# Patient Record
Sex: Male | Born: 1960 | Race: Black or African American | Hispanic: No | Marital: Married | Smoking: Former smoker
Health system: Southern US, Community
[De-identification: ages and names within clinical notes are randomized; demographics above are authoritative.]

## PROBLEM LIST (undated history)

## (undated) DIAGNOSIS — Z79899 Other long term (current) drug therapy: Secondary | ICD-10-CM

## (undated) DIAGNOSIS — E119 Type 2 diabetes mellitus without complications: Secondary | ICD-10-CM

## (undated) DIAGNOSIS — C801 Malignant (primary) neoplasm, unspecified: Secondary | ICD-10-CM

## (undated) DIAGNOSIS — I10 Essential (primary) hypertension: Secondary | ICD-10-CM

## (undated) DIAGNOSIS — D649 Anemia, unspecified: Secondary | ICD-10-CM

## (undated) DIAGNOSIS — Z7969 Long term (current) use of other immunomodulators and immunosuppressants: Secondary | ICD-10-CM

## (undated) HISTORY — PX: COLON SURGERY: SHX602

---

## 2011-06-07 HISTORY — PX: PORTACATH PLACEMENT: SHX2246

## 2014-06-19 ENCOUNTER — Other Ambulatory Visit: Payer: Self-pay | Admitting: Hematology & Oncology

## 2014-06-19 DIAGNOSIS — C787 Secondary malignant neoplasm of liver and intrahepatic bile duct: Principal | ICD-10-CM

## 2014-06-19 DIAGNOSIS — C20 Malignant neoplasm of rectum: Secondary | ICD-10-CM

## 2014-07-01 ENCOUNTER — Ambulatory Visit
Admission: RE | Admit: 2014-07-01 | Discharge: 2014-07-01 | Disposition: A | Discharge status: Home / Self Care | Payer: Medicare Other | Source: Ambulatory Visit | Attending: Hematology & Oncology | Admitting: Hematology & Oncology

## 2014-07-01 DIAGNOSIS — C787 Secondary malignant neoplasm of liver and intrahepatic bile duct: Principal | ICD-10-CM

## 2014-07-01 DIAGNOSIS — C20 Malignant neoplasm of rectum: Secondary | ICD-10-CM

## 2014-07-01 HISTORY — DX: Other long term (current) drug therapy: Z79.899

## 2014-07-01 HISTORY — DX: Long term (current) use of other immunomodulators and immunosuppressants: Z79.69

## 2014-07-01 HISTORY — PX: IR GENERIC HISTORICAL: IMG1180011

## 2014-07-01 HISTORY — DX: Anemia, unspecified: D64.9

## 2014-07-01 HISTORY — DX: Malignant (primary) neoplasm, unspecified: C80.1

## 2014-07-01 HISTORY — DX: Essential (primary) hypertension: I10

## 2014-07-01 NOTE — Consult Note (Addendum)
Chief Complaint: Chief Complaint  Patient presents with  . Advice Only    Consult for Y-90 SIRT for liver metastases    Referring Physician(s): Harish,V.C.  History of Present Illness: Marcus Buck is a 54 y.o. 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  He is currently status post 45 palliative treatments of systemic chemotherapy with FOLFOX plus Avastin.  Surveillance imaging in January 2015 demonstrated progression of hepatic metastatic disease, therefore he was transitioned to FOLFIRI plus Avastin and is now status post 26 treatments.    Initial response to second line therapy was positive with initial response to therapy followed by stable disease. However, continued surveillance imaging obtained on 06/09/2014 again demonstrated progression of his hepatic metastatic disease. His Avastin was held at his last treatment session.   Today, Marcus Buck is completely asymptomatic. He denies abdominal pain, nausea, vomiting, bright red blood per rectum or melanotic stools. He has no fever, chills or new unintentional weight loss. He continues to work part-time and is able to perform all of his activities of daily living without issue. His current functional status is excellent.   Past Medical History  Diagnosis Date  . Hypertension   . Anemia   . Palliative chemotherapy underway   . Cancer     Stage IV Metastatic adenocarcinoma of Liver, primary rectum    No past surgical history on file.  Allergies: Review of patient's allergies indicates no known allergies.  Medications: Prior to Admission medications   Medication Sig Start Date End Date Taking? Authorizing Provider  amLODipine (NORVASC) 5 MG tablet Take 5 mg by mouth 2 (two) times daily.   Yes Historical Provider, MD  lisinopril  (PRINIVIL,ZESTRIL) 20 MG tablet Take 20 mg by mouth 2 (two) times daily.   Yes Historical Provider, MD    No family history on file.  History   Social History  . Marital Status: Married    Spouse Name: N/A    Number of Children: N/A  . Years of Education: N/A   Social History Main Topics  . Smoking status: Former Smoker -- 0.26 packs/day    Types: Cigarettes    Start date: 07/01/1974    Quit date: 07/01/1988  . Smokeless tobacco: Not on file  . Alcohol Use: 3.6 oz/week    6 Cans of beer per week  . Drug Use: No  . Sexual Activity: Not on file   Other Topics Concern  . Not on file   Social History Narrative  . No narrative on file    ECOG Status: 0 - Asymptomatic  Review of Systems: A 12 point ROS discussed and pertinent positives are indicated in the HPI above.  All other systems are negative.  Review of Systems  Vital Signs: BP 177/105 mmHg  Pulse 74  Temp(Src) 98.4 F (36.9 C) (Oral)  Resp 14  Ht 5' 10"  (1.778 m)  Wt 175 lb (79.379 kg)  BMI 25.11 kg/m2  SpO2 100%  Physical Exam  Constitutional: He is oriented to person, place, and time. He appears well-developed and well-nourished.  HENT:  Head: Normocephalic and atraumatic.  Eyes: No scleral icterus.  Cardiovascular: Normal rate.   Pulmonary/Chest: Effort normal.  Abdominal: Soft. He exhibits no distension.  Neurological: He is alert and oriented to person, place, and time.  Skin: Skin is warm and  dry.  Psychiatric: He has a normal mood and affect. His behavior is normal.  Nursing note and vitals reviewed.   Imaging: No results found.  Labs:  CBC: Outside labs from [facility] dated 06/16/2014 include WBC 9.9, Hgb 14.6, HCT 45.1, PLT 239  COAGS: No results for input(s): INR, APTT in the last 8760 hours.  BMP: Outside labs from [facility] dated 06/16/2014 include creatinine 1.0 and EGFR 78  LIVER FUNCTION TESTS: Outside labs from [facility] dated 06/16/2014 include total bilirubin 1.0, AST 28, ALT 57, albumin 4.6  TUMOR MARKERS: No results for input(s): AFPTM, CEA, CA199, CHROMGRNA in the last 8760 hours.  Assessment and Plan:  54 year old gentleman with Stage 4 chemotherapy refractive colorectal cancer metastatic to the liver. He is an excellent candidate for concomitant transarterial radioembolization (TARE) with Y90 as he continues additional chemotherapy.  He has been off Avastin for the past 3-4 weeks and is therefore ready to proceed with his initial planning treatments followed by first left, and then right hepatic lobar treatments.  His baseline liver function appears excellent.  We will get him scheduled for our next available pre-Y90 planning procedure.     Thank you for this interesting consult.  I greatly enjoyed meeting Marcus Buck and look forward to participating in their care.   SignedJacqulynn Cadet 07/01/2014, 9:23 AM   I spent a total of 30 minutes face to face in clinical consultation, greater than 50% of which was counseling/coordinating care for colorectal cancer metastatic to the liver.

## 2014-07-10 ENCOUNTER — Other Ambulatory Visit: Payer: Self-pay | Admitting: Interventional Radiology

## 2014-07-10 DIAGNOSIS — C787 Secondary malignant neoplasm of liver and intrahepatic bile duct: Principal | ICD-10-CM

## 2014-07-10 DIAGNOSIS — C189 Malignant neoplasm of colon, unspecified: Secondary | ICD-10-CM

## 2014-07-22 ENCOUNTER — Other Ambulatory Visit: Payer: Self-pay | Admitting: Radiology

## 2014-07-24 ENCOUNTER — Ambulatory Visit (HOSPITAL_COMMUNITY)
Admission: RE | Admit: 2014-07-24 | Discharge: 2014-07-24 | Disposition: A | Discharge status: Home / Self Care | Payer: Medicare Other | Source: Ambulatory Visit | Attending: Interventional Radiology | Admitting: Interventional Radiology

## 2014-07-24 ENCOUNTER — Encounter (HOSPITAL_COMMUNITY): Payer: Medicare Other

## 2014-07-24 ENCOUNTER — Encounter (HOSPITAL_COMMUNITY)
Admission: RE | Admit: 2014-07-24 | Discharge: 2014-07-24 | Disposition: A | Discharge status: Home / Self Care | Payer: Medicare Other | Source: Ambulatory Visit | Attending: Interventional Radiology | Admitting: Interventional Radiology

## 2014-07-24 ENCOUNTER — Other Ambulatory Visit: Payer: Self-pay | Admitting: Interventional Radiology

## 2014-07-24 ENCOUNTER — Encounter (HOSPITAL_COMMUNITY): Payer: Self-pay

## 2014-07-24 DIAGNOSIS — Z87891 Personal history of nicotine dependence: Secondary | ICD-10-CM | POA: Diagnosis not present

## 2014-07-24 DIAGNOSIS — I10 Essential (primary) hypertension: Secondary | ICD-10-CM | POA: Diagnosis not present

## 2014-07-24 DIAGNOSIS — C20 Malignant neoplasm of rectum: Secondary | ICD-10-CM | POA: Insufficient documentation

## 2014-07-24 DIAGNOSIS — C189 Malignant neoplasm of colon, unspecified: Secondary | ICD-10-CM

## 2014-07-24 DIAGNOSIS — C19 Malignant neoplasm of rectosigmoid junction: Secondary | ICD-10-CM | POA: Insufficient documentation

## 2014-07-24 DIAGNOSIS — C787 Secondary malignant neoplasm of liver and intrahepatic bile duct: Principal | ICD-10-CM

## 2014-07-24 DIAGNOSIS — D649 Anemia, unspecified: Secondary | ICD-10-CM | POA: Diagnosis not present

## 2014-07-24 LAB — PROTIME-INR
INR: 0.91 (ref 0.00–1.49)
PROTHROMBIN TIME: 12.3 s (ref 11.6–15.2)

## 2014-07-24 LAB — COMPREHENSIVE METABOLIC PANEL
ALT: 31 U/L (ref 0–53)
AST: 43 U/L — ABNORMAL HIGH (ref 0–37)
Albumin: 3.9 g/dL (ref 3.5–5.2)
Alkaline Phosphatase: 86 U/L (ref 39–117)
Anion gap: 9 (ref 5–15)
BILIRUBIN TOTAL: 0.4 mg/dL (ref 0.3–1.2)
BUN: 13 mg/dL (ref 6–23)
CALCIUM: 9.3 mg/dL (ref 8.4–10.5)
CO2: 27 mmol/L (ref 19–32)
CREATININE: 0.88 mg/dL (ref 0.50–1.35)
Chloride: 105 mmol/L (ref 96–112)
GFR calc Af Amer: >90 mL/min (ref >90)
Glucose, Bld: 84 mg/dL (ref 70–99)
Potassium: 4.2 mmol/L (ref 3.5–5.1)
Sodium: 141 mmol/L (ref 135–145)
Total Protein: 6.9 g/dL (ref 6.0–8.3)

## 2014-07-24 LAB — CBC WITH DIFFERENTIAL/PLATELET
Basophils Absolute: 0 10*3/uL (ref 0.0–0.1)
Basophils Relative: 1 % (ref 0–1)
Eosinophils Absolute: 0.2 10*3/uL (ref 0.0–0.7)
Eosinophils Relative: 4 % (ref 0–5)
HEMATOCRIT: 38.3 % — AB (ref 39.0–52.0)
HEMOGLOBIN: 12.5 g/dL — AB (ref 13.0–17.0)
LYMPHS PCT: 19 % (ref 12–46)
Lymphs Abs: 0.8 10*3/uL (ref 0.7–4.0)
MCH: 30 pg (ref 26.0–34.0)
MCHC: 32.6 g/dL (ref 30.0–36.0)
MCV: 91.8 fL (ref 78.0–100.0)
MONO ABS: 0.4 10*3/uL (ref 0.1–1.0)
MONOS PCT: 10 % (ref 3–12)
NEUTROS ABS: 2.6 10*3/uL (ref 1.7–7.7)
NEUTROS PCT: 66 % (ref 43–77)
Platelets: 225 10*3/uL (ref 150–400)
RBC: 4.17 MIL/uL — ABNORMAL LOW (ref 4.22–5.81)
RDW: 14.1 % (ref 11.5–15.5)
WBC: 3.9 10*3/uL — AB (ref 4.0–10.5)

## 2014-07-24 LAB — BASIC METABOLIC PANEL
ANION GAP: 6 (ref 5–15)
BUN: 14 mg/dL (ref 6–23)
CO2: 28 mmol/L (ref 19–32)
Calcium: 9 mg/dL (ref 8.4–10.5)
Chloride: 106 mmol/L (ref 96–112)
Creatinine, Ser: 0.86 mg/dL (ref 0.50–1.35)
GFR calc Af Amer: >90 mL/min (ref >90)
GFR calc non Af Amer: >90 mL/min (ref >90)
Glucose, Bld: 92 mg/dL (ref 70–99)
Potassium: 4.1 mmol/L (ref 3.5–5.1)
SODIUM: 140 mmol/L (ref 135–145)

## 2014-07-24 LAB — APTT: aPTT: 28 seconds (ref 24–37)

## 2014-07-24 MED ORDER — FENTANYL CITRATE 0.05 MG/ML IJ SOLN
INTRAMUSCULAR | Status: AC
  Filled 2014-07-24: qty 4

## 2014-07-24 MED ORDER — MIDAZOLAM HCL 2 MG/2ML IJ SOLN
INTRAMUSCULAR | Status: AC
  Filled 2014-07-24: qty 4

## 2014-07-24 MED ORDER — HYDROCODONE-ACETAMINOPHEN 5-325 MG PO TABS: 1.0000 | ORAL_TABLET | ORAL | Status: DC | PRN

## 2014-07-24 MED ORDER — LIDOCAINE HCL 1 % IJ SOLN
INTRAMUSCULAR | Status: AC
  Filled 2014-07-24: qty 20

## 2014-07-24 MED ORDER — MIDAZOLAM HCL 2 MG/2ML IJ SOLN
INTRAMUSCULAR | Status: AC
  Filled 2014-07-24: qty 6

## 2014-07-24 MED ORDER — SODIUM CHLORIDE 0.9 % IV SOLN
INTRAVENOUS | Status: DC
  Administered 2014-07-24: 08:00:00 via INTRAVENOUS

## 2014-07-24 MED ORDER — IOHEXOL 300 MG/ML  SOLN: 100.0000 mL | Freq: Once | INTRAMUSCULAR | Status: AC | PRN

## 2014-07-24 MED ORDER — FENTANYL CITRATE 0.05 MG/ML IJ SOLN
INTRAMUSCULAR | Status: AC | PRN
  Administered 2014-07-24 (×6): 25 ug via INTRAVENOUS
  Administered 2014-07-24: 50 ug via INTRAVENOUS

## 2014-07-24 MED ORDER — TECHNETIUM TO 99M ALBUMIN AGGREGATED
4.4000 | Freq: Once | INTRAVENOUS | Status: AC | PRN
  Administered 2014-07-24: 4 via INTRAVENOUS

## 2014-07-24 MED ORDER — FENTANYL CITRATE 0.05 MG/ML IJ SOLN
INTRAMUSCULAR | Status: AC
  Filled 2014-07-24: qty 2

## 2014-07-24 MED ORDER — MIDAZOLAM HCL 2 MG/2ML IJ SOLN
INTRAMUSCULAR | Status: AC | PRN
  Administered 2014-07-24 (×3): 0.5 mg via INTRAVENOUS
  Administered 2014-07-24: 1 mg via INTRAVENOUS
  Administered 2014-07-24 (×3): 0.5 mg via INTRAVENOUS
  Administered 2014-07-24: 1 mg via INTRAVENOUS
  Administered 2014-07-24: 0.5 mg via INTRAVENOUS
  Administered 2014-07-24 (×2): 1 mg via INTRAVENOUS
  Administered 2014-07-24: 0.5 mg via INTRAVENOUS

## 2014-07-24 NOTE — Sedation Documentation (Signed)
Gauze/tegaderm bandage applied to RFA puncture site.  CDI, Level 0, 2+RDP.

## 2014-07-24 NOTE — Progress Notes (Signed)
Post Pre-Y 90 observation. Pt ambulated in room with minimal assist Pt tolerated this well Right groin site is CDI  No swelling. Pt eager to go home.

## 2014-07-24 NOTE — Discharge Instructions (Signed)
Conscious Sedation, Adult, Care After Refer to this sheet in the next few weeks. These instructions provide you with information on caring for yourself after your procedure. Your health care provider may also give you more specific instructions. Your treatment has been planned according to current medical practices, but problems sometimes occur. Call your health care provider if you have any problems or questions after your procedure. WHAT TO EXPECT AFTER THE PROCEDURE  After your procedure:  You may feel sleepy, clumsy, and have poor balance for several hours.  Vomiting may occur if you eat too soon after the procedure. HOME CARE INSTRUCTIONS  Do not participate in any activities where you could become injured for at least 24 hours. Do not:  Drive.  Swim.  Ride a bicycle.  Operate heavy machinery.  Cook.  Use power tools.  Climb ladders.  Work from a high place.  Do not make important decisions or sign legal documents until you are improved.  If you vomit, drink water, juice, or soup when you can drink without vomiting. Make sure you have little or no nausea before eating solid foods.  Only take over-the-counter or prescription medicines for pain, discomfort, or fever as directed by your health care provider.  Make sure you and your family fully understand everything about the medicines given to you, including what side effects may occur.  You should not drink alcohol, take sleeping pills, or take medicines that cause drowsiness for at least 24 hours.  If you smoke, do not smoke without supervision.  If you are feeling better, you may resume normal activities 24 hours after you were sedated.  Keep all appointments with your health care provider. SEEK MEDICAL CARE IF:  Your skin is pale or bluish in color.  You continue to feel nauseous or vomit.  Your pain is getting worse and is not helped by medicine.  You have bleeding or swelling.  You are still sleepy or  feeling clumsy after 24 hours. SEEK IMMEDIATE MEDICAL CARE IF:  You develop a rash.  You have difficulty breathing.  You develop any type of allergic problem.  You have a fever. MAKE SURE YOU:  Understand these instructions.  Will watch your condition.  Will get help right away if you are not doing well or get worse. Document Released: 03/13/2013 Document Reviewed: 03/13/2013 [facility] Patient Information 2015 [place], [place]. This information is not intended to replace advice given to you by your health care provider. Make sure you discuss any questions you have with your health care provider.  Angiogram, Care After Refer to this sheet in the next few weeks. These instructions provide you with information on caring for yourself after your procedure. Your health care provider may also give you more specific instructions. Your treatment has been planned according to current medical practices, but problems sometimes occur. Call your health care provider if you have any problems or questions after your procedure.  WHAT TO EXPECT AFTER THE PROCEDURE After your procedure, it is typical to have the following sensations:  Minor discomfort or tenderness and a small bump at the catheter insertion site. The bump should usually decrease in size and tenderness within 1 to 2 weeks.  Any bruising will usually fade within 2 to 4 weeks. HOME CARE INSTRUCTIONS   You may need to keep taking blood thinners if they were prescribed for you. Take medicines only as directed by your health care provider.  Do not apply powder or lotion to the site.  Do not  take baths, swim, or use a hot tub until your health care provider approves.  You may shower 24 hours after the procedure. Remove the bandage (dressing) and gently wash the site with plain soap and water. Gently pat the site dry.  Inspect the site at least twice daily.  Limit your activity for the first 48 hours. Do not bend, squat, or lift anything  over 20 lb (9 kg) or as directed by your health care provider.  Plan to have someone take you home after the procedure. Follow instructions about when you can drive or return to work. SEEK MEDICAL CARE IF:  You get light-headed when standing up.  You have drainage (other than a small amount of blood on the dressing).  You have chills.  You have a fever.  You have redness, warmth, swelling, or pain at the insertion site. SEEK IMMEDIATE MEDICAL CARE IF:   You develop chest pain or shortness of breath, feel faint, or pass out.  You have bleeding, swelling larger than a walnut, or drainage from the catheter insertion site.  You develop pain, discoloration, coldness, or severe bruising in the leg or arm that held the catheter.  You develop bleeding from any other place, such as the bowels. You may see bright red blood in your urine or stools, or your stools may appear black and tarry.  You have heavy bleeding from the site. If this happens, hold pressure on the site. MAKE SURE YOU:  Understand these instructions.  Will watch your condition.  Will get help right away if you are not doing well or get worse. Document Released: 12/09/2004 Document Revised: 10/07/2013 Document Reviewed: 10/15/2012 [facility] Patient Information 2015 [place], [place]. This information is not intended to replace advice given to you by your health care provider. Make sure you discuss any questions you have with your health care provider.  This is just for FYI for next procedure, the following apply to the Y-90 procedure scheduled for March 2: Please read over them so you will be familiar with the expectations for the actual Y-90 procedure.   Post Y-90 Radioembolization Discharge Instructions  You have been given a radioactive material during your procedure.  While it is safe for you to be discharged home from the hospital, you need to proceed directly home.    Do not use public transportation, including air  travel, lasting more than 2 hours for 1 week.  Avoid crowded public places for 1 week.  Adult visitors should try to avoid close contact with you for 1 week.    Children and pregnant females should not visit or have close contact with you for 1 week.  Items that you touch are not radioactive.  Do not sleep in the same bed as your partner for 1 week, and a condom should be used for sexual activity during the first 24 hours.  Your blood may be radioactive and caution should be used if any bleeding occurs during the recovery period.  Body fluids may be radioactive for 24 hours.  Wash your hands after voiding.  Men should sit to urinate.  Dispose of any soiled materials (flush down toilet or place in trash at home) during the first day.  Drink 6 to 8 glasses of fluids per day for 5 days to hydrate yourself.  If you need to see a doctor during the first week, you must let them know that you were treated with yttrium-90 microspheres, and will be slightly radioactive.  They can call  Interventional Radiology [phone] with any questions.

## 2014-07-24 NOTE — H&P (Signed)
Chief Complaint: "I am here for my liver procedure."  Referring Physician(s): McCullough,Heath  History of Present Illness: Marcus Buck is a 54 y.o. 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 Surveillance imaging in January 2015 demonstrated progression of hepatic metastatic disease, therefore he was transitioned to FOLFIRI plus Avastin and is now status post 26 treatments.Imaging obtained on 06/09/2014 again demonstrated progression of his hepatic metastatic disease. His Avastin was held at his last treatment session. He has been seen on 07/01/14 in consult to discuss further options regarding metastatic disease in his liver. He has been scheduled today for pre-Y-90 angiogram and embolization. He denies any abdominal pain, chest pain, shortness of breath or palpitations. He denies any nausea or vomiting. He denies any active signs of bleeding or excessive bruising. He denies any recent fever or chills. The patient denies any history of sleep apnea or chronic oxygen use. He has previously tolerated sedation without complications.    Past Medical History  Diagnosis Date  . Hypertension   . Anemia   . Palliative chemotherapy underway   . Cancer     Stage IV Metastatic adenocarcinoma of Liver, primary rectum    History reviewed. No pertinent past surgical history.  Allergies: Review of patient's allergies indicates no known allergies.  Medications: Prior to Admission medications   Medication Sig Start Date End Date Taking? Authorizing Provider  amLODipine (NORVASC) 5 MG tablet Take 5 mg by mouth 2 (two) times daily.   Yes Historical Provider, MD  lisinopril (PRINIVIL,ZESTRIL) 20 MG tablet Take 20 mg by mouth 2 (two) times daily.   Yes Historical Provider, MD     History reviewed. No pertinent family  history.  History   Social History  . Marital Status: Married    Spouse Name: N/A  . Number of Children: N/A  . Years of Education: N/A   Social History Main Topics  . Smoking status: Former Smoker -- 0.26 packs/day    Types: Cigarettes    Start date: 07/01/1974    Quit date: 07/01/1988  . Smokeless tobacco: Not on file  . Alcohol Use: 3.6 oz/week    6 Cans of beer per week  . Drug Use: No  . Sexual Activity: Not on file   Other Topics Concern  . None   Social History Narrative   Review of Systems: A 12 point ROS discussed and pertinent positives are indicated in the HPI above.  All other systems are negative.  Review of Systems  Vital Signs: BP 134/76 mmHg  Pulse 70  Temp(Src) 98.2 F (36.8 C) (Oral)  Resp 16  Ht _0  (1.778 m)  Wt 175 lb (79.379 kg)  BMI 25.11 kg/m2  SpO2 100%  Physical Exam  Constitutional: He is oriented to person, place, and time. No distress.  HENT:  Head: Normocephalic and atraumatic.  Neck: No tracheal deviation present.  Cardiovascular: Normal rate and regular rhythm.  Exam reveals no gallop and no friction rub.   No murmur heard. Pulmonary/Chest: Effort normal and breath sounds normal. No respiratory distress. He has no wheezes. He has no rales.  Abdominal: Soft. Bowel sounds are normal. He exhibits no distension. There is no tenderness.  Neurological: He is alert and oriented to person, place, and time.  Skin: Skin is warm and dry. He is not diaphoretic.  Psychiatric: He has a normal mood  and affect. His behavior is normal. Thought content normal.    Mallampati Score:  MD Evaluation Airway: WNL Heart: WNL Abdomen: WNL Chest/ Lungs: WNL ASA  Classification: 3 Mallampati/Airway Score: Two  Imaging: No results found.  Labs:  CBC:  Recent Labs  07/24/14 0739  WBC 3.9*  HGB 12.5*  HCT 38.3*  PLT 225    COAGS:  Recent Labs  07/24/14 0739  INR 0.91  APTT 28    BMP:  Recent Labs  07/24/14 0739  NA 140   K 4.1  CL 106  CO2 28  GLUCOSE 92  BUN 14  CALCIUM 9.0  CREATININE 0.86  GFRNONAA >90  GFRAA >90   Assessment and Plan: Refractive colorectal cancer metastatic to the liver Seen in consult with Dr. Laurence Ferrari on 07/01/14 Scheduled today for image guided pre-Y-90 hepatic angiogram and embolization with moderate sedation Patient has been NPO, no blood thinners taken, labs reviewed, afebrile Risks and Benefits discussed with the patient. All of the patient's questions were answered, patient is agreeable to proceed. Consent signed and in chart.   Thank you for this interesting consult.  I greatly enjoyed meeting Marcus Buck and look forward to participating in their care.  SignedHedy Jacob 07/24/2014, 9:24 AM   I spent a total of in face to face in clinical consultation, greater than 50% of which was counseling/coordinating care for refractive colorectal cancer metastatic to the liver.

## 2014-07-24 NOTE — Procedures (Signed)
Interventional Radiology Procedure Note  Procedure:  1.) Pre-Y90 hepatic mapping study 2.) Embolization supraduodenal, GDA, right gastric and segment 4 hepatic arteries Complications: None Recommendations:  - To Nucs - Orders in CPOE  Signed,  Criselda Peaches, MD

## 2014-07-24 NOTE — Sedation Documentation (Signed)
5Fr sheath removed from R femoral artery by Dr. Laurence Ferrari.  Hemostasis achieved using Exoseal device.  Groin level 0, 2+RDP.

## 2014-07-30 ENCOUNTER — Encounter: Payer: Self-pay | Admitting: *Deleted

## 2014-08-04 ENCOUNTER — Other Ambulatory Visit: Payer: Self-pay | Admitting: Radiology

## 2014-08-05 ENCOUNTER — Other Ambulatory Visit: Payer: Self-pay | Admitting: Radiology

## 2014-08-06 ENCOUNTER — Other Ambulatory Visit: Payer: Self-pay | Admitting: Interventional Radiology

## 2014-08-06 ENCOUNTER — Encounter (HOSPITAL_COMMUNITY)
Admission: RE | Admit: 2014-08-06 | Discharge: 2014-08-06 | Disposition: A | Discharge status: Home / Self Care | Payer: Medicare Other | Source: Ambulatory Visit | Attending: Interventional Radiology | Admitting: Interventional Radiology

## 2014-08-06 ENCOUNTER — Ambulatory Visit (HOSPITAL_COMMUNITY)
Admission: RE | Admit: 2014-08-06 | Discharge: 2014-08-06 | Disposition: A | Discharge status: Home / Self Care | Payer: Medicare Other | Source: Ambulatory Visit | Attending: Interventional Radiology | Admitting: Interventional Radiology

## 2014-08-06 ENCOUNTER — Encounter (HOSPITAL_COMMUNITY): Payer: Self-pay

## 2014-08-06 ENCOUNTER — Other Ambulatory Visit: Payer: Self-pay

## 2014-08-06 DIAGNOSIS — C189 Malignant neoplasm of colon, unspecified: Secondary | ICD-10-CM

## 2014-08-06 DIAGNOSIS — C19 Malignant neoplasm of rectosigmoid junction: Secondary | ICD-10-CM | POA: Insufficient documentation

## 2014-08-06 DIAGNOSIS — C787 Secondary malignant neoplasm of liver and intrahepatic bile duct: Secondary | ICD-10-CM | POA: Diagnosis present

## 2014-08-06 DIAGNOSIS — Z79899 Other long term (current) drug therapy: Secondary | ICD-10-CM | POA: Diagnosis not present

## 2014-08-06 DIAGNOSIS — I10 Essential (primary) hypertension: Secondary | ICD-10-CM | POA: Diagnosis not present

## 2014-08-06 DIAGNOSIS — Z87891 Personal history of nicotine dependence: Secondary | ICD-10-CM | POA: Diagnosis not present

## 2014-08-06 LAB — PROTIME-INR
INR: 0.89 (ref 0.00–1.49)
PROTHROMBIN TIME: 12.1 s (ref 11.6–15.2)

## 2014-08-06 LAB — CBC WITH DIFFERENTIAL/PLATELET
BASOS PCT: 1 % (ref 0–1)
Basophils Absolute: 0 10*3/uL (ref 0.0–0.1)
Eosinophils Absolute: 0.3 10*3/uL (ref 0.0–0.7)
Eosinophils Relative: 5 % (ref 0–5)
HEMATOCRIT: 41.6 % (ref 39.0–52.0)
Hemoglobin: 13.4 g/dL (ref 13.0–17.0)
LYMPHS ABS: 0.6 10*3/uL — AB (ref 0.7–4.0)
LYMPHS PCT: 10 % — AB (ref 12–46)
MCH: 29.5 pg (ref 26.0–34.0)
MCHC: 32.2 g/dL (ref 30.0–36.0)
MCV: 91.4 fL (ref 78.0–100.0)
MONOS PCT: 26 % — AB (ref 3–12)
Monocytes Absolute: 1.6 10*3/uL — ABNORMAL HIGH (ref 0.1–1.0)
NEUTROS ABS: 3.6 10*3/uL (ref 1.7–7.7)
NEUTROS PCT: 60 % (ref 43–77)
Platelets: 451 10*3/uL — ABNORMAL HIGH (ref 150–400)
RBC: 4.55 MIL/uL (ref 4.22–5.81)
RDW: 14.5 % (ref 11.5–15.5)
WBC: 6.1 10*3/uL (ref 4.0–10.5)

## 2014-08-06 LAB — COMPREHENSIVE METABOLIC PANEL
ALBUMIN: 3.8 g/dL (ref 3.5–5.2)
ALT: 62 U/L — AB (ref 0–53)
ANION GAP: 7 (ref 5–15)
AST: 72 U/L — AB (ref 0–37)
Alkaline Phosphatase: 191 U/L — ABNORMAL HIGH (ref 39–117)
BILIRUBIN TOTAL: 0.5 mg/dL (ref 0.3–1.2)
BUN: 12 mg/dL (ref 6–23)
CHLORIDE: 102 mmol/L (ref 96–112)
CO2: 26 mmol/L (ref 19–32)
Calcium: 9.1 mg/dL (ref 8.4–10.5)
Creatinine, Ser: 0.77 mg/dL (ref 0.50–1.35)
GFR calc Af Amer: >90 mL/min (ref >90)
GFR calc non Af Amer: >90 mL/min (ref >90)
Glucose, Bld: 103 mg/dL — ABNORMAL HIGH (ref 70–99)
POTASSIUM: 4.4 mmol/L (ref 3.5–5.1)
Sodium: 135 mmol/L (ref 135–145)
TOTAL PROTEIN: 7.9 g/dL (ref 6.0–8.3)

## 2014-08-06 LAB — APTT: aPTT: 31 seconds (ref 24–37)

## 2014-08-06 MED ORDER — SODIUM CHLORIDE 0.9 % IV SOLN
Freq: Once | INTRAVENOUS | Status: AC
  Administered 2014-08-06: 500 mL via INTRAVENOUS

## 2014-08-06 MED ORDER — HYDROCODONE-ACETAMINOPHEN 5-325 MG PO TABS: 1.0000 | ORAL_TABLET | ORAL | Status: DC | PRN

## 2014-08-06 MED ORDER — LIDOCAINE HCL 1 % IJ SOLN
INTRAMUSCULAR | Status: AC
  Filled 2014-08-06: qty 20

## 2014-08-06 MED ORDER — DEXAMETHASONE SODIUM PHOSPHATE 10 MG/ML IJ SOLN
20.0000 mg | Freq: Once | INTRAMUSCULAR | Status: AC
  Administered 2014-08-06: 20 mg via INTRAVENOUS
  Filled 2014-08-06: qty 2

## 2014-08-06 MED ORDER — ATROPINE SULFATE 0.1 MG/ML IJ SOLN
INTRAMUSCULAR | Status: AC
  Filled 2014-08-06: qty 10

## 2014-08-06 MED ORDER — ONDANSETRON HCL 4 MG/2ML IJ SOLN
4.0000 mg | Freq: Once | INTRAMUSCULAR | Status: AC
  Administered 2014-08-06: 4 mg via INTRAVENOUS
  Filled 2014-08-06: qty 2

## 2014-08-06 MED ORDER — NALOXONE HCL 0.4 MG/ML IJ SOLN
INTRAMUSCULAR | Status: AC
  Filled 2014-08-06: qty 1

## 2014-08-06 MED ORDER — FENTANYL CITRATE 0.05 MG/ML IJ SOLN
INTRAMUSCULAR | Status: AC
  Filled 2014-08-06: qty 4

## 2014-08-06 MED ORDER — MIDAZOLAM HCL 2 MG/2ML IJ SOLN
INTRAMUSCULAR | Status: AC
  Filled 2014-08-06: qty 6

## 2014-08-06 MED ORDER — SUCRALFATE 1 GM/10ML PO SUSP
1.0000 g | Freq: Three times a day (TID) | ORAL | Status: DC
  Administered 2014-08-06: 1 g via ORAL
  Filled 2014-08-06: qty 10

## 2014-08-06 MED ORDER — FENTANYL CITRATE 0.05 MG/ML IJ SOLN
INTRAMUSCULAR | Status: AC | PRN
  Administered 2014-08-06 (×2): 25 ug via INTRAVENOUS
  Administered 2014-08-06: 50 ug via INTRAVENOUS

## 2014-08-06 MED ORDER — PIPERACILLIN-TAZOBACTAM 3.375 G IVPB: 3.3750 g | Freq: Once | INTRAVENOUS | Status: DC

## 2014-08-06 MED ORDER — PIPERACILLIN-TAZOBACTAM 3.375 G IVPB 30 MIN
3.3750 g | Freq: Once | INTRAVENOUS | Status: AC
  Administered 2014-08-06: 3.375 g via INTRAVENOUS
  Filled 2014-08-06: qty 50

## 2014-08-06 MED ORDER — MIDAZOLAM HCL 2 MG/2ML IJ SOLN
INTRAMUSCULAR | Status: AC | PRN
  Administered 2014-08-06 (×2): 1 mg via INTRAVENOUS
  Administered 2014-08-06: 0.5 mg via INTRAVENOUS

## 2014-08-06 MED ORDER — PANTOPRAZOLE SODIUM 40 MG IV SOLR
40.0000 mg | Freq: Once | INTRAVENOUS | Status: AC
  Administered 2014-08-06: 40 mg via INTRAVENOUS
  Filled 2014-08-06: qty 40

## 2014-08-06 NOTE — Sedation Documentation (Addendum)
Pt experienced episode of coughing and became diaphoretic, tachy HR 120-129, BP 121/95 at 1052. Immediately following becoming brady HR 40s. Patient was suctioned and cool wash cloth was administered to forehead. Pt denied any chest pain but stated that he was experiencing back pain. Pillow was removed from behind head to help with the pain. Dr. Laurence Ferrari ordered 12-Lead EKG. 12-Lead EKG was performed and showed NSR. Pt denied any nausea or pain and procedure was continued. Dr. Laurence Ferrari determined that patient experienced a vasovagal syncope episode and that was the cause of pts sudden tachycardia to sudden bradycardia. Will continue to monitor pt status closely.

## 2014-08-06 NOTE — Progress Notes (Signed)
Ambulated to BR with assist. Tolerated well.

## 2014-08-06 NOTE — H&P (Signed)
Chief Complaint: Metastatic colon cancer to liver  Referring Physician(s): Dr. Cruzita Lederer  History of Present Illness: Marcus Buck is a 54 y.o. 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 Surveillance imaging in January 2015 demonstrated progression of hepatic metastatic disease, therefore he was transitioned to FOLFIRI plus Avastin and is now status post 26 treatments.Imaging obtained on 06/09/2014 again demonstrated progression of his hepatic metastatic disease. His Avastin was held at his last treatment session. He has been seen on 07/01/14 in consult to discuss further options regarding metastatic disease in his liver. He presents today for hepatic Y-90 radioembolization.   Past Medical History  Diagnosis Date  . Hypertension   . Anemia   . Palliative chemotherapy underway   . Cancer     Stage IV Metastatic adenocarcinoma of Liver, primary rectum    History reviewed. No pertinent past surgical history.  Allergies: Review of patient's allergies indicates no known allergies.  Medications: Prior to Admission medications   Medication Sig Start Date End Date Taking? Authorizing Provider  acetaminophen (TYLENOL) 500 MG tablet Take 500 mg by mouth every 6 (six) hours as needed for fever.   Yes Historical Provider, MD  amLODipine (NORVASC) 5 MG tablet Take 5 mg by mouth 2 (two) times daily.   Yes Historical Provider, MD  lisinopril (PRINIVIL,ZESTRIL) 20 MG tablet Take 20 mg by mouth 2 (two) times daily.   Yes Historical Provider, MD    History reviewed. No pertinent family history.  History   Social History  . Marital Status: Married    Spouse Name: N/A  . Number of Children: N/A  . Years of Education: N/A   Social History Main Topics  . Smoking status: Former Smoker -- 0.26 packs/day    Types: Cigarettes     Start date: 07/01/1974    Quit date: 07/01/1988  . Smokeless tobacco: Not on file  . Alcohol Use: 3.6 oz/week    6 Cans of beer per week  . Drug Use: No  . Sexual Activity: Not on file   Other Topics Concern  . None   Social History Narrative     Review of Systems   Constitutional: Negative for fever and chills.  Respiratory: Negative for cough and shortness of breath.   Cardiovascular: Negative for chest pain.  Gastrointestinal: Negative for nausea, vomiting, abdominal pain and blood in stool.  Genitourinary: Negative for dysuria and hematuria.  Musculoskeletal: Negative for back pain.  Neurological: Negative for headaches.  Hematological: Does not bruise/bleed easily.    Vital Signs: BP 127/76 mmHg  Pulse 81  Temp(Src) 98.7 F (37.1 C) (Oral)  Resp 18  Ht 5' 10"  (1.778 m)  Wt 172 lb (78.019 kg)  BMI 24.68 kg/m2  SpO2 99%  Physical Exam  Constitutional: He is oriented to person, place, and time. He appears well-developed and well-nourished.  Cardiovascular: Normal rate and regular rhythm.   Pulmonary/Chest: Effort normal and breath sounds normal.  Abdominal: Soft. Bowel sounds are normal. There is no tenderness.  Musculoskeletal: Normal range of motion. He exhibits no edema.  Neurological: He is alert and oriented to person, place, and time.    Imaging: Nm Liver Img Spect  07/24/2014   CLINICAL DATA:  Colorectal carcinoma with unresectable liver metastasis. Pre Yttrium 90 evaluation.  EXAM: NUCLEAR MEDICINE LIVER SCAN; 3D fusion  TECHNIQUE: Abdominal images were obtained in  multiple projections after intrahepatic arterial injection of radiopharmaceutical. SPECT imaging was performed. Lung shunt calculation was performed. CT fusion was performed.  RADIOPHARMACEUTICALS:  4MILLI CURIE MAA TECHNETIUM TO 75M ALBUMIN AGGREGATED  COMPARISON:  CT 06/09/2014  FINDINGS: The injected microaggregated albumin localizes within the liver. No evidence of activity within the  stomach, duodenum, or bowel.  Calculated shunt fraction to the lungs equals 5%.  IMPRESSION: 1. No significant extrahepatic radiotracer activity following intrahepatic arterial injection of MAA. 2. Lung shunt fraction equals 5%   Electronically Signed   By: Suzy Bouchard M.D.   On: 07/24/2014 16:54   Ir Angiogram Visceral Selective  07/24/2014   CLINICAL DATA:  54 year old male with metastatic colorectal cancer. He presents today for his pre Y 90 mapping study, possible branch artery embolization and injection of Tc-MAA to evaluate the lung shunt fraction.  EXAM: IR EMBO ARTERIAL NOT HEMORR HEMANG INC GUIDE ROADMAPPING; ADDITIONAL ARTERIOGRAPHY; SELECTIVE VISCERAL ARTERIOGRAPHY; IR ULTRASOUND GUIDANCE VASC ACCESS RIGHT  Date: 07/24/2014  PROCEDURE: 1. Ultrasound-guided puncture of the right common femoral artery 2. Catheterization of the superior mesenteric artery with arteriogram 3. Catheterization of the celiac artery with arteriogram 4. Catheterization of the common hepatic artery with arteriogram 5. Catheterization of a supraduodenal artery with arteriogram 6. Coil embolization of supraduodenal artery 7. Catheterization of the gastroduodenal artery with arteriogram 8. Coil embolization of the gastroduodenal artery 9. Catheterization of the right gastric artery with arteriogram 10. Coil embolization of the right gastric artery 11. Catheterization of a small accessory middle hepatic artery 12. Coil embolization of the small accessory middle hepatic artery 13. Catheterization of the common trunk of the left gastric and left hepatic artery with arteriogram 14. Catheterization of the left hepatic artery with arteriogram 15. Injection of Tc-MAA into the left hepatic artery 16. Re- catheterization of the right hepatic artery 17. Injection of macro aggregated albumin into the right hepatic artery 18. Limited right common femoral arteriogram 19. Application of Cordis ExoSeal device Interventional Radiologist:  Criselda Peaches, MD  ANESTHESIA/SEDATION: Moderate (conscious) sedation was used. 8 mg Versed, 200 mcg Fentanyl were administered intravenously. The patient's vital signs were monitored continuously by radiology nursing throughout the procedure.  Sedation Time: 123 minutes  MEDICATIONS: None additional  FLUOROSCOPY TIME:  33 minutes  5411 mGy  CONTRAST:  180 mL Omnipaque 350  TECHNIQUE: Informed consent was obtained from the patient following explanation of the procedure, risks, benefits and alternatives. The patient understands, agrees and consents for the procedure. All questions were addressed. A time out was performed.  Maximal barrier sterile technique utilized including caps, mask, sterile gowns, sterile gloves, large sterile drape, hand hygiene, and Betadine skin prep.  The right groin was interrogated with ultrasound. The common femoral artery is widely patent. An image was obtained and stored for the medical record. Local anesthesia was attained by infiltration with 1% lidocaine. A small dermatotomy was made. Under real-time sonographic guidance, the mid common femoral artery was punctured with a 21 gauge micropuncture needle. An image was obtained and stored.  With the assistance of a 4 [place] transitional micro sheath, the initial 0.018 inch wire was exchanged for a 0.035 inch Bentson wire. The micro sheath was then exchanged for a working 5 [place] vascular sheath. A C2 cobra catheter was advanced into the abdominal aorta. The catheter was used to select the superior mesenteric artery. A superior mesenteric arteriogram was performed in carried through the portal venous phase. There is no evidence of replaced or accessory right hepatic  artery. The portal vein is widely patent with normal hepatopetal flow.  The C2 cobra catheter was then exchanged for an RC2 catheter which was used to select the celiac artery. A celiac arteriogram was then performed. The left hepatic artery is replaced to the left gastric  artery. The right gastric artery is visible in appears to arise just distal to the origin of the gastroduodenal artery. There is a prominent super duodenum branch which may share a common origin with the gastroduodenal artery.  The micro catheter was then advanced into the common hepatic artery and a hepatic arteriogram was performed confirming the above findings. The micro catheter was next advanced into the super duodenum artery which does arise from a common origin with the gastroduodenal artery. This provides extensive flow to the first and second portions of the duodenum. Interestingly, the cystic artery arises from this branch. The decision was made to coil embolize this artery. Coil embolization was performed using first a 2 x 3 x 2 interlocked cortex coil followed by 3 x 6, and 3 x 12 interlock coils. The micro catheter was then readvanced into the gastroduodenal artery. This was embolized using first a 2 x 5 x 5 interlock vortex coil followed by a series of the 5 x 15 (2x), 6 x 20 (2x) and 6 x 10 (2x)interlock coils. Post embolization arteriography confirms absence of flow into both the super duodenum and the gastroduodenal arteries.  The micro catheter was next used to select the right gastric artery. This was coil embolized with a 2 x 3 x 2 cortex coil followed by a 3 x 12 interlock coil. The micro catheter  The accessory segment 4 artery is relatively small in fairly close to several tiny wispy branches arising directly from the right hepatic artery. The decision was made to coil embolize this in an effort to increase intrahepatic collateralization from the left hepatic artery as the patient will ultimately require both the right and left hepatic treatment. Therefore, the micro catheter was carefully advanced into this artery which was coil embolized with a combination of a 2 x 4 followed by a 3 x 6 mm interlock soft coils.  The micro catheter was removed. A left anterior oblique arteriogram was  performed through the 5 French catheter outlining the common origin of the left gastric and left hepatic arteries. The micro catheter was reintroduced into this common origin vessel and an arteriogram was performed. The left gastric artery is robust. Cross-filling with the right gastric artery is identified. Cross-filling stops at the coil pack from the recent embolization. There are several small gastric branches arising from the most proximal left hepatic artery. The micro catheter was advanced into the more distal left hepatic artery beyond these branches and an arteriogram was performed in multiple obliquities. No evidence of gastric or other extrahepatic branches. Tumor blush is identified.  Approximately 25% of the dose of technetium MAA was injected into the left hepatic artery. The micro catheter was flushed and then navigated back into the right hepatic artery. The remaining 75% of the technetium MAA dose was injected into the right hepatic artery.  The micro catheter and 5 French catheter were removed. A limited right common femoral arteriogram was performed confirming right common femoral arterial access. Hemostasis was then attained with the assistance of a Cordis ExoSeal extra arterial closure device.  COMPLICATIONS: None  IMPRESSION: 1. Successful pre Y90 planning study. 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 Additionally, the cystic artery arises from a supraduodenal artery which shares a common origin with the gastroduodenal artery. Finally, there are some small accessory segment 4 branches which arises directly from the proximal right hepatic artery. 2. Successful coil embolization of the super duodenum artery, gastroduodenal artery, right gastric artery and the more proximal accessory segment 4 branch artery. 3. Successful injection of technetium MAA with 25% into the left hepatic artery and 75% into the right hepatic artery.  PLAN:  1. Return in 1-2 weeks for left lobar treatment. 2. Four weeks following the left lobar treatment patient will return for right lobar treatment.  Signed,  Criselda Peaches, MD  [facility]  [facility]   Electronically Signed   By: Jacqulynn Cadet M.D.   On: 07/24/2014 16:56   Ir Angiogram Visceral Selective  07/24/2014   CLINICAL DATA:  54 year old male with metastatic colorectal cancer. He presents today for his pre Y 90 mapping study, possible branch artery embolization and injection of Tc-MAA to evaluate the lung shunt fraction.  EXAM: IR EMBO ARTERIAL NOT HEMORR HEMANG INC GUIDE ROADMAPPING; ADDITIONAL ARTERIOGRAPHY; SELECTIVE VISCERAL ARTERIOGRAPHY; IR ULTRASOUND GUIDANCE VASC ACCESS RIGHT  Date: 07/24/2014  PROCEDURE: 1. Ultrasound-guided puncture of the right common femoral artery 2. Catheterization of the superior mesenteric artery with arteriogram 3. Catheterization of the celiac artery with arteriogram 4. Catheterization of the common hepatic artery with arteriogram 5. Catheterization of a supraduodenal artery with arteriogram 6. Coil embolization of supraduodenal artery 7. Catheterization of the gastroduodenal artery with arteriogram 8. Coil embolization of the gastroduodenal artery 9. Catheterization of the right gastric artery with arteriogram 10. Coil embolization of the right gastric artery 11. Catheterization of a small accessory middle hepatic artery 12. Coil embolization of the small accessory middle hepatic artery 13. Catheterization of the common trunk of the left gastric and left hepatic artery with arteriogram 14. Catheterization of the left hepatic artery with arteriogram 15. Injection of Tc-MAA into the left hepatic artery 16. Re- catheterization of the right hepatic artery 17. Injection of macro aggregated albumin into the right hepatic artery 18. Limited right common femoral arteriogram 19. Application of Cordis ExoSeal device  Interventional Radiologist:  Criselda Peaches, MD  ANESTHESIA/SEDATION: Moderate (conscious) sedation was used. 8 mg Versed, 200 mcg Fentanyl were administered intravenously. The patient's vital signs were monitored continuously by radiology nursing throughout the procedure.  Sedation Time: 123 minutes  MEDICATIONS: None additional  FLUOROSCOPY TIME:  33 minutes  5411 mGy  CONTRAST:  180 mL Omnipaque 350  TECHNIQUE: Informed consent was obtained from the patient following explanation of the procedure, risks, benefits and alternatives. The patient understands, agrees and consents for the procedure. All questions were addressed. A time out was performed.  Maximal barrier sterile technique utilized including caps, mask, sterile gowns, sterile gloves, large sterile drape, hand hygiene, and Betadine skin prep.  The right groin was interrogated with ultrasound. The common femoral artery is widely patent. An image was obtained and stored for the medical record. Local anesthesia was attained by infiltration with 1% lidocaine. A small dermatotomy was made. Under real-time sonographic guidance, the mid common femoral artery was punctured with a 21 gauge micropuncture needle. An image was obtained and stored.  With the assistance of a 4 [place] transitional micro sheath, the initial 0.018 inch wire was exchanged for a 0.035 inch Bentson wire. The micro sheath was then exchanged for a working 5 [place] vascular sheath. A C2 cobra catheter was advanced into  the abdominal aorta. The catheter was used to select the superior mesenteric artery. A superior mesenteric arteriogram was performed in carried through the portal venous phase. There is no evidence of replaced or accessory right hepatic artery. The portal vein is widely patent with normal hepatopetal flow.  The C2 cobra catheter was then exchanged for an RC2 catheter which was used to select the celiac artery. A celiac arteriogram was then performed. The left hepatic artery  is replaced to the left gastric artery. The right gastric artery is visible in appears to arise just distal to the origin of the gastroduodenal artery. There is a prominent super duodenum branch which may share a common origin with the gastroduodenal artery.  The micro catheter was then advanced into the common hepatic artery and a hepatic arteriogram was performed confirming the above findings. The micro catheter was next advanced into the super duodenum artery which does arise from a common origin with the gastroduodenal artery. This provides extensive flow to the first and second portions of the duodenum. Interestingly, the cystic artery arises from this branch. The decision was made to coil embolize this artery. Coil embolization was performed using first a 2 x 3 x 2 interlocked cortex coil followed by 3 x 6, and 3 x 12 interlock coils. The micro catheter was then readvanced into the gastroduodenal artery. This was embolized using first a 2 x 5 x 5 interlock vortex coil followed by a series of the 5 x 15 (2x), 6 x 20 (2x) and 6 x 10 (2x)interlock coils. Post embolization arteriography confirms absence of flow into both the super duodenum and the gastroduodenal arteries.  The micro catheter was next used to select the right gastric artery. This was coil embolized with a 2 x 3 x 2 cortex coil followed by a 3 x 12 interlock coil. The micro catheter  The accessory segment 4 artery is relatively small in fairly close to several tiny wispy branches arising directly from the right hepatic artery. The decision was made to coil embolize this in an effort to increase intrahepatic collateralization from the left hepatic artery as the patient will ultimately require both the right and left hepatic treatment. Therefore, the micro catheter was carefully advanced into this artery which was coil embolized with a combination of a 2 x 4 followed by a 3 x 6 mm interlock soft coils.  The micro catheter was removed. A left anterior  oblique arteriogram was performed through the 5 French catheter outlining the common origin of the left gastric and left hepatic arteries. The micro catheter was reintroduced into this common origin vessel and an arteriogram was performed. The left gastric artery is robust. Cross-filling with the right gastric artery is identified. Cross-filling stops at the coil pack from the recent embolization. There are several small gastric branches arising from the most proximal left hepatic artery. The micro catheter was advanced into the more distal left hepatic artery beyond these branches and an arteriogram was performed in multiple obliquities. No evidence of gastric or other extrahepatic branches. Tumor blush is identified.  Approximately 25% of the dose of technetium MAA was injected into the left hepatic artery. The micro catheter was flushed and then navigated back into the right hepatic artery. The remaining 75% of the technetium MAA dose was injected into the right hepatic artery.  The micro catheter and 5 French catheter were removed. A limited right common femoral arteriogram was performed confirming right common femoral arterial access. Hemostasis was then attained with  the assistance of a Cordis ExoSeal extra arterial closure device.  COMPLICATIONS: None  IMPRESSION: 1. Successful pre Y90 planning study. 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 Additionally, the cystic artery arises from a supraduodenal artery which shares a common origin with the gastroduodenal artery. Finally, there are some small accessory segment 4 branches which arises directly from the proximal right hepatic artery. 2. Successful coil embolization of the super duodenum artery, gastroduodenal artery, right gastric artery and the more proximal accessory segment 4 branch artery. 3. Successful injection of technetium MAA with 25% into the left hepatic artery and 75% into the right  hepatic artery.  PLAN: 1. Return in 1-2 weeks for left lobar treatment. 2. Four weeks following the left lobar treatment patient will return for right lobar treatment.  Signed,  Criselda Peaches, MD  [facility]  [facility]   Electronically Signed   By: Jacqulynn Cadet M.D.   On: 07/24/2014 16:56   Ir Angiogram Selective Each Additional Vessel  07/24/2014   CLINICAL DATA:  54 year old male with metastatic colorectal cancer. He presents today for his pre Y 90 mapping study, possible branch artery embolization and injection of Tc-MAA to evaluate the lung shunt fraction.  EXAM: IR EMBO ARTERIAL NOT HEMORR HEMANG INC GUIDE ROADMAPPING; ADDITIONAL ARTERIOGRAPHY; SELECTIVE VISCERAL ARTERIOGRAPHY; IR ULTRASOUND GUIDANCE VASC ACCESS RIGHT  Date: 07/24/2014  PROCEDURE: 1. Ultrasound-guided puncture of the right common femoral artery 2. Catheterization of the superior mesenteric artery with arteriogram 3. Catheterization of the celiac artery with arteriogram 4. Catheterization of the common hepatic artery with arteriogram 5. Catheterization of a supraduodenal artery with arteriogram 6. Coil embolization of supraduodenal artery 7. Catheterization of the gastroduodenal artery with arteriogram 8. Coil embolization of the gastroduodenal artery 9. Catheterization of the right gastric artery with arteriogram 10. Coil embolization of the right gastric artery 11. Catheterization of a small accessory middle hepatic artery 12. Coil embolization of the small accessory middle hepatic artery 13. Catheterization of the common trunk of the left gastric and left hepatic artery with arteriogram 14. Catheterization of the left hepatic artery with arteriogram 15. Injection of Tc-MAA into the left hepatic artery 16. Re- catheterization of the right hepatic artery 17. Injection of macro aggregated albumin into the right hepatic artery 18. Limited right common femoral arteriogram 19.  Application of Cordis ExoSeal device Interventional Radiologist:  Criselda Peaches, MD  ANESTHESIA/SEDATION: Moderate (conscious) sedation was used. 8 mg Versed, 200 mcg Fentanyl were administered intravenously. The patient's vital signs were monitored continuously by radiology nursing throughout the procedure.  Sedation Time: 123 minutes  MEDICATIONS: None additional  FLUOROSCOPY TIME:  33 minutes  5411 mGy  CONTRAST:  180 mL Omnipaque 350  TECHNIQUE: Informed consent was obtained from the patient following explanation of the procedure, risks, benefits and alternatives. The patient understands, agrees and consents for the procedure. All questions were addressed. A time out was performed.  Maximal barrier sterile technique utilized including caps, mask, sterile gowns, sterile gloves, large sterile drape, hand hygiene, and Betadine skin prep.  The right groin was interrogated with ultrasound. The common femoral artery is widely patent. An image was obtained and stored for the medical record. Local anesthesia was attained by infiltration with 1% lidocaine. A small dermatotomy was made. Under real-time sonographic guidance, the mid common femoral artery was punctured with a 21 gauge micropuncture needle. An image was obtained and stored.  With the assistance of a  4 [place] transitional micro sheath, the initial 0.018 inch wire was exchanged for a 0.035 inch Bentson wire. The micro sheath was then exchanged for a working 5 [place] vascular sheath. A C2 cobra catheter was advanced into the abdominal aorta. The catheter was used to select the superior mesenteric artery. A superior mesenteric arteriogram was performed in carried through the portal venous phase. There is no evidence of replaced or accessory right hepatic artery. The portal vein is widely patent with normal hepatopetal flow.  The C2 cobra catheter was then exchanged for an RC2 catheter which was used to select the celiac artery. A celiac arteriogram was  then performed. The left hepatic artery is replaced to the left gastric artery. The right gastric artery is visible in appears to arise just distal to the origin of the gastroduodenal artery. There is a prominent super duodenum branch which may share a common origin with the gastroduodenal artery.  The micro catheter was then advanced into the common hepatic artery and a hepatic arteriogram was performed confirming the above findings. The micro catheter was next advanced into the super duodenum artery which does arise from a common origin with the gastroduodenal artery. This provides extensive flow to the first and second portions of the duodenum. Interestingly, the cystic artery arises from this branch. The decision was made to coil embolize this artery. Coil embolization was performed using first a 2 x 3 x 2 interlocked cortex coil followed by 3 x 6, and 3 x 12 interlock coils. The micro catheter was then readvanced into the gastroduodenal artery. This was embolized using first a 2 x 5 x 5 interlock vortex coil followed by a series of the 5 x 15 (2x), 6 x 20 (2x) and 6 x 10 (2x)interlock coils. Post embolization arteriography confirms absence of flow into both the super duodenum and the gastroduodenal arteries.  The micro catheter was next used to select the right gastric artery. This was coil embolized with a 2 x 3 x 2 cortex coil followed by a 3 x 12 interlock coil. The micro catheter  The accessory segment 4 artery is relatively small in fairly close to several tiny wispy branches arising directly from the right hepatic artery. The decision was made to coil embolize this in an effort to increase intrahepatic collateralization from the left hepatic artery as the patient will ultimately require both the right and left hepatic treatment. Therefore, the micro catheter was carefully advanced into this artery which was coil embolized with a combination of a 2 x 4 followed by a 3 x 6 mm interlock soft coils.  The  micro catheter was removed. A left anterior oblique arteriogram was performed through the 5 French catheter outlining the common origin of the left gastric and left hepatic arteries. The micro catheter was reintroduced into this common origin vessel and an arteriogram was performed. The left gastric artery is robust. Cross-filling with the right gastric artery is identified. Cross-filling stops at the coil pack from the recent embolization. There are several small gastric branches arising from the most proximal left hepatic artery. The micro catheter was advanced into the more distal left hepatic artery beyond these branches and an arteriogram was performed in multiple obliquities. No evidence of gastric or other extrahepatic branches. Tumor blush is identified.  Approximately 25% of the dose of technetium MAA was injected into the left hepatic artery. The micro catheter was flushed and then navigated back into the right hepatic artery. The remaining 75% of the technetium  MAA dose was injected into the right hepatic artery.  The micro catheter and 5 French catheter were removed. A limited right common femoral arteriogram was performed confirming right common femoral arterial access. Hemostasis was then attained with the assistance of a Cordis ExoSeal extra arterial closure device.  COMPLICATIONS: None  IMPRESSION: 1. Successful pre Y90 planning study. 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 Additionally, the cystic artery arises from a supraduodenal artery which shares a common origin with the gastroduodenal artery. Finally, there are some small accessory segment 4 branches which arises directly from the proximal right hepatic artery. 2. Successful coil embolization of the super duodenum artery, gastroduodenal artery, right gastric artery and the more proximal accessory segment 4 branch artery. 3. Successful injection of technetium MAA with 25% into the  left hepatic artery and 75% into the right hepatic artery.  PLAN: 1. Return in 1-2 weeks for left lobar treatment. 2. Four weeks following the left lobar treatment patient will return for right lobar treatment.  Signed,  Criselda Peaches, MD  [facility]  [facility]   Electronically Signed   By: Jacqulynn Cadet M.D.   On: 07/24/2014 16:56   Ir Angiogram Selective Each Additional Vessel  07/24/2014   CLINICAL DATA:  54 year old male with metastatic colorectal cancer. He presents today for his pre Y 90 mapping study, possible branch artery embolization and injection of Tc-MAA to evaluate the lung shunt fraction.  EXAM: IR EMBO ARTERIAL NOT HEMORR HEMANG INC GUIDE ROADMAPPING; ADDITIONAL ARTERIOGRAPHY; SELECTIVE VISCERAL ARTERIOGRAPHY; IR ULTRASOUND GUIDANCE VASC ACCESS RIGHT  Date: 07/24/2014  PROCEDURE: 1. Ultrasound-guided puncture of the right common femoral artery 2. Catheterization of the superior mesenteric artery with arteriogram 3. Catheterization of the celiac artery with arteriogram 4. Catheterization of the common hepatic artery with arteriogram 5. Catheterization of a supraduodenal artery with arteriogram 6. Coil embolization of supraduodenal artery 7. Catheterization of the gastroduodenal artery with arteriogram 8. Coil embolization of the gastroduodenal artery 9. Catheterization of the right gastric artery with arteriogram 10. Coil embolization of the right gastric artery 11. Catheterization of a small accessory middle hepatic artery 12. Coil embolization of the small accessory middle hepatic artery 13. Catheterization of the common trunk of the left gastric and left hepatic artery with arteriogram 14. Catheterization of the left hepatic artery with arteriogram 15. Injection of Tc-MAA into the left hepatic artery 16. Re- catheterization of the right hepatic artery 17. Injection of macro aggregated albumin into the right hepatic artery 18. Limited  right common femoral arteriogram 19. Application of Cordis ExoSeal device Interventional Radiologist:  Criselda Peaches, MD  ANESTHESIA/SEDATION: Moderate (conscious) sedation was used. 8 mg Versed, 200 mcg Fentanyl were administered intravenously. The patient's vital signs were monitored continuously by radiology nursing throughout the procedure.  Sedation Time: 123 minutes  MEDICATIONS: None additional  FLUOROSCOPY TIME:  33 minutes  5411 mGy  CONTRAST:  180 mL Omnipaque 350  TECHNIQUE: Informed consent was obtained from the patient following explanation of the procedure, risks, benefits and alternatives. The patient understands, agrees and consents for the procedure. All questions were addressed. A time out was performed.  Maximal barrier sterile technique utilized including caps, mask, sterile gowns, sterile gloves, large sterile drape, hand hygiene, and Betadine skin prep.  The right groin was interrogated with ultrasound. The common femoral artery is widely patent. An image was obtained and stored for the medical record. Local anesthesia was attained by  infiltration with 1% lidocaine. A small dermatotomy was made. Under real-time sonographic guidance, the mid common femoral artery was punctured with a 21 gauge micropuncture needle. An image was obtained and stored.  With the assistance of a 4 [place] transitional micro sheath, the initial 0.018 inch wire was exchanged for a 0.035 inch Bentson wire. The micro sheath was then exchanged for a working 5 [place] vascular sheath. A C2 cobra catheter was advanced into the abdominal aorta. The catheter was used to select the superior mesenteric artery. A superior mesenteric arteriogram was performed in carried through the portal venous phase. There is no evidence of replaced or accessory right hepatic artery. The portal vein is widely patent with normal hepatopetal flow.  The C2 cobra catheter was then exchanged for an RC2 catheter which was used to select the celiac  artery. A celiac arteriogram was then performed. The left hepatic artery is replaced to the left gastric artery. The right gastric artery is visible in appears to arise just distal to the origin of the gastroduodenal artery. There is a prominent super duodenum branch which may share a common origin with the gastroduodenal artery.  The micro catheter was then advanced into the common hepatic artery and a hepatic arteriogram was performed confirming the above findings. The micro catheter was next advanced into the super duodenum artery which does arise from a common origin with the gastroduodenal artery. This provides extensive flow to the first and second portions of the duodenum. Interestingly, the cystic artery arises from this branch. The decision was made to coil embolize this artery. Coil embolization was performed using first a 2 x 3 x 2 interlocked cortex coil followed by 3 x 6, and 3 x 12 interlock coils. The micro catheter was then readvanced into the gastroduodenal artery. This was embolized using first a 2 x 5 x 5 interlock vortex coil followed by a series of the 5 x 15 (2x), 6 x 20 (2x) and 6 x 10 (2x)interlock coils. Post embolization arteriography confirms absence of flow into both the super duodenum and the gastroduodenal arteries.  The micro catheter was next used to select the right gastric artery. This was coil embolized with a 2 x 3 x 2 cortex coil followed by a 3 x 12 interlock coil. The micro catheter  The accessory segment 4 artery is relatively small in fairly close to several tiny wispy branches arising directly from the right hepatic artery. The decision was made to coil embolize this in an effort to increase intrahepatic collateralization from the left hepatic artery as the patient will ultimately require both the right and left hepatic treatment. Therefore, the micro catheter was carefully advanced into this artery which was coil embolized with a combination of a 2 x 4 followed by a 3 x 6  mm interlock soft coils.  The micro catheter was removed. A left anterior oblique arteriogram was performed through the 5 French catheter outlining the common origin of the left gastric and left hepatic arteries. The micro catheter was reintroduced into this common origin vessel and an arteriogram was performed. The left gastric artery is robust. Cross-filling with the right gastric artery is identified. Cross-filling stops at the coil pack from the recent embolization. There are several small gastric branches arising from the most proximal left hepatic artery. The micro catheter was advanced into the more distal left hepatic artery beyond these branches and an arteriogram was performed in multiple obliquities. No evidence of gastric or other extrahepatic branches. Tumor blush  is identified.  Approximately 25% of the dose of technetium MAA was injected into the left hepatic artery. The micro catheter was flushed and then navigated back into the right hepatic artery. The remaining 75% of the technetium MAA dose was injected into the right hepatic artery.  The micro catheter and 5 French catheter were removed. A limited right common femoral arteriogram was performed confirming right common femoral arterial access. Hemostasis was then attained with the assistance of a Cordis ExoSeal extra arterial closure device.  COMPLICATIONS: None  IMPRESSION: 1. Successful pre Y90 planning study. 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 Additionally, the cystic artery arises from a supraduodenal artery which shares a common origin with the gastroduodenal artery. Finally, there are some small accessory segment 4 branches which arises directly from the proximal right hepatic artery. 2. Successful coil embolization of the super duodenum artery, gastroduodenal artery, right gastric artery and the more proximal accessory segment 4 branch artery. 3. Successful injection of  technetium MAA with 25% into the left hepatic artery and 75% into the right hepatic artery.  PLAN: 1. Return in 1-2 weeks for left lobar treatment. 2. Four weeks following the left lobar treatment patient will return for right lobar treatment.  Signed,  Criselda Peaches, MD  [facility]  [facility]   Electronically Signed   By: Jacqulynn Cadet M.D.   On: 07/24/2014 16:56   Ir Angiogram Selective Each Additional Vessel  07/24/2014   CLINICAL DATA:  54 year old male with metastatic colorectal cancer. He presents today for his pre Y 90 mapping study, possible branch artery embolization and injection of Tc-MAA to evaluate the lung shunt fraction.  EXAM: IR EMBO ARTERIAL NOT HEMORR HEMANG INC GUIDE ROADMAPPING; ADDITIONAL ARTERIOGRAPHY; SELECTIVE VISCERAL ARTERIOGRAPHY; IR ULTRASOUND GUIDANCE VASC ACCESS RIGHT  Date: 07/24/2014  PROCEDURE: 1. Ultrasound-guided puncture of the right common femoral artery 2. Catheterization of the superior mesenteric artery with arteriogram 3. Catheterization of the celiac artery with arteriogram 4. Catheterization of the common hepatic artery with arteriogram 5. Catheterization of a supraduodenal artery with arteriogram 6. Coil embolization of supraduodenal artery 7. Catheterization of the gastroduodenal artery with arteriogram 8. Coil embolization of the gastroduodenal artery 9. Catheterization of the right gastric artery with arteriogram 10. Coil embolization of the right gastric artery 11. Catheterization of a small accessory middle hepatic artery 12. Coil embolization of the small accessory middle hepatic artery 13. Catheterization of the common trunk of the left gastric and left hepatic artery with arteriogram 14. Catheterization of the left hepatic artery with arteriogram 15. Injection of Tc-MAA into the left hepatic artery 16. Re- catheterization of the right hepatic artery 17. Injection of macro aggregated albumin into the  right hepatic artery 18. Limited right common femoral arteriogram 19. Application of Cordis ExoSeal device Interventional Radiologist:  Criselda Peaches, MD  ANESTHESIA/SEDATION: Moderate (conscious) sedation was used. 8 mg Versed, 200 mcg Fentanyl were administered intravenously. The patient's vital signs were monitored continuously by radiology nursing throughout the procedure.  Sedation Time: 123 minutes  MEDICATIONS: None additional  FLUOROSCOPY TIME:  33 minutes  5411 mGy  CONTRAST:  180 mL Omnipaque 350  TECHNIQUE: Informed consent was obtained from the patient following explanation of the procedure, risks, benefits and alternatives. The patient understands, agrees and consents for the procedure. All questions were addressed. A time out was performed.  Maximal barrier sterile technique utilized including caps, mask, sterile gowns, sterile gloves, large  sterile drape, hand hygiene, and Betadine skin prep.  The right groin was interrogated with ultrasound. The common femoral artery is widely patent. An image was obtained and stored for the medical record. Local anesthesia was attained by infiltration with 1% lidocaine. A small dermatotomy was made. Under real-time sonographic guidance, the mid common femoral artery was punctured with a 21 gauge micropuncture needle. An image was obtained and stored.  With the assistance of a 4 [place] transitional micro sheath, the initial 0.018 inch wire was exchanged for a 0.035 inch Bentson wire. The micro sheath was then exchanged for a working 5 [place] vascular sheath. A C2 cobra catheter was advanced into the abdominal aorta. The catheter was used to select the superior mesenteric artery. A superior mesenteric arteriogram was performed in carried through the portal venous phase. There is no evidence of replaced or accessory right hepatic artery. The portal vein is widely patent with normal hepatopetal flow.  The C2 cobra catheter was then exchanged for an RC2 catheter  which was used to select the celiac artery. A celiac arteriogram was then performed. The left hepatic artery is replaced to the left gastric artery. The right gastric artery is visible in appears to arise just distal to the origin of the gastroduodenal artery. There is a prominent super duodenum branch which may share a common origin with the gastroduodenal artery.  The micro catheter was then advanced into the common hepatic artery and a hepatic arteriogram was performed confirming the above findings. The micro catheter was next advanced into the super duodenum artery which does arise from a common origin with the gastroduodenal artery. This provides extensive flow to the first and second portions of the duodenum. Interestingly, the cystic artery arises from this branch. The decision was made to coil embolize this artery. Coil embolization was performed using first a 2 x 3 x 2 interlocked cortex coil followed by 3 x 6, and 3 x 12 interlock coils. The micro catheter was then readvanced into the gastroduodenal artery. This was embolized using first a 2 x 5 x 5 interlock vortex coil followed by a series of the 5 x 15 (2x), 6 x 20 (2x) and 6 x 10 (2x)interlock coils. Post embolization arteriography confirms absence of flow into both the super duodenum and the gastroduodenal arteries.  The micro catheter was next used to select the right gastric artery. This was coil embolized with a 2 x 3 x 2 cortex coil followed by a 3 x 12 interlock coil. The micro catheter  The accessory segment 4 artery is relatively small in fairly close to several tiny wispy branches arising directly from the right hepatic artery. The decision was made to coil embolize this in an effort to increase intrahepatic collateralization from the left hepatic artery as the patient will ultimately require both the right and left hepatic treatment. Therefore, the micro catheter was carefully advanced into this artery which was coil embolized with a  combination of a 2 x 4 followed by a 3 x 6 mm interlock soft coils.  The micro catheter was removed. A left anterior oblique arteriogram was performed through the 5 French catheter outlining the common origin of the left gastric and left hepatic arteries. The micro catheter was reintroduced into this common origin vessel and an arteriogram was performed. The left gastric artery is robust. Cross-filling with the right gastric artery is identified. Cross-filling stops at the coil pack from the recent embolization. There are several small gastric branches arising from the  most proximal left hepatic artery. The micro catheter was advanced into the more distal left hepatic artery beyond these branches and an arteriogram was performed in multiple obliquities. No evidence of gastric or other extrahepatic branches. Tumor blush is identified.  Approximately 25% of the dose of technetium MAA was injected into the left hepatic artery. The micro catheter was flushed and then navigated back into the right hepatic artery. The remaining 75% of the technetium MAA dose was injected into the right hepatic artery.  The micro catheter and 5 French catheter were removed. A limited right common femoral arteriogram was performed confirming right common femoral arterial access. Hemostasis was then attained with the assistance of a Cordis ExoSeal extra arterial closure device.  COMPLICATIONS: None  IMPRESSION: 1. Successful pre Y90 planning study. 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 Additionally, the cystic artery arises from a supraduodenal artery which shares a common origin with the gastroduodenal artery. Finally, there are some small accessory segment 4 branches which arises directly from the proximal right hepatic artery. 2. Successful coil embolization of the super duodenum artery, gastroduodenal artery, right gastric artery and the more proximal accessory segment 4  branch artery. 3. Successful injection of technetium MAA with 25% into the left hepatic artery and 75% into the right hepatic artery.  PLAN: 1. Return in 1-2 weeks for left lobar treatment. 2. Four weeks following the left lobar treatment patient will return for right lobar treatment.  Signed,  Criselda Peaches, MD  [facility]  [facility]   Electronically Signed   By: Jacqulynn Cadet M.D.   On: 07/24/2014 16:56   Ir Angiogram Selective Each Additional Vessel  07/24/2014   CLINICAL DATA:  54 year old male with metastatic colorectal cancer. He presents today for his pre Y 90 mapping study, possible branch artery embolization and injection of Tc-MAA to evaluate the lung shunt fraction.  EXAM: IR EMBO ARTERIAL NOT HEMORR HEMANG INC GUIDE ROADMAPPING; ADDITIONAL ARTERIOGRAPHY; SELECTIVE VISCERAL ARTERIOGRAPHY; IR ULTRASOUND GUIDANCE VASC ACCESS RIGHT  Date: 07/24/2014  PROCEDURE: 1. Ultrasound-guided puncture of the right common femoral artery 2. Catheterization of the superior mesenteric artery with arteriogram 3. Catheterization of the celiac artery with arteriogram 4. Catheterization of the common hepatic artery with arteriogram 5. Catheterization of a supraduodenal artery with arteriogram 6. Coil embolization of supraduodenal artery 7. Catheterization of the gastroduodenal artery with arteriogram 8. Coil embolization of the gastroduodenal artery 9. Catheterization of the right gastric artery with arteriogram 10. Coil embolization of the right gastric artery 11. Catheterization of a small accessory middle hepatic artery 12. Coil embolization of the small accessory middle hepatic artery 13. Catheterization of the common trunk of the left gastric and left hepatic artery with arteriogram 14. Catheterization of the left hepatic artery with arteriogram 15. Injection of Tc-MAA into the left hepatic artery 16. Re- catheterization of the right hepatic artery 17.  Injection of macro aggregated albumin into the right hepatic artery 18. Limited right common femoral arteriogram 19. Application of Cordis ExoSeal device Interventional Radiologist:  Criselda Peaches, MD  ANESTHESIA/SEDATION: Moderate (conscious) sedation was used. 8 mg Versed, 200 mcg Fentanyl were administered intravenously. The patient's vital signs were monitored continuously by radiology nursing throughout the procedure.  Sedation Time: 123 minutes  MEDICATIONS: None additional  FLUOROSCOPY TIME:  33 minutes  5411 mGy  CONTRAST:  180 mL Omnipaque 350  TECHNIQUE: Informed consent was obtained from the patient following explanation of  the procedure, risks, benefits and alternatives. The patient understands, agrees and consents for the procedure. All questions were addressed. A time out was performed.  Maximal barrier sterile technique utilized including caps, mask, sterile gowns, sterile gloves, large sterile drape, hand hygiene, and Betadine skin prep.  The right groin was interrogated with ultrasound. The common femoral artery is widely patent. An image was obtained and stored for the medical record. Local anesthesia was attained by infiltration with 1% lidocaine. A small dermatotomy was made. Under real-time sonographic guidance, the mid common femoral artery was punctured with a 21 gauge micropuncture needle. An image was obtained and stored.  With the assistance of a 4 [place] transitional micro sheath, the initial 0.018 inch wire was exchanged for a 0.035 inch Bentson wire. The micro sheath was then exchanged for a working 5 [place] vascular sheath. A C2 cobra catheter was advanced into the abdominal aorta. The catheter was used to select the superior mesenteric artery. A superior mesenteric arteriogram was performed in carried through the portal venous phase. There is no evidence of replaced or accessory right hepatic artery. The portal vein is widely patent with normal hepatopetal flow.  The C2 cobra  catheter was then exchanged for an RC2 catheter which was used to select the celiac artery. A celiac arteriogram was then performed. The left hepatic artery is replaced to the left gastric artery. The right gastric artery is visible in appears to arise just distal to the origin of the gastroduodenal artery. There is a prominent super duodenum branch which may share a common origin with the gastroduodenal artery.  The micro catheter was then advanced into the common hepatic artery and a hepatic arteriogram was performed confirming the above findings. The micro catheter was next advanced into the super duodenum artery which does arise from a common origin with the gastroduodenal artery. This provides extensive flow to the first and second portions of the duodenum. Interestingly, the cystic artery arises from this branch. The decision was made to coil embolize this artery. Coil embolization was performed using first a 2 x 3 x 2 interlocked cortex coil followed by 3 x 6, and 3 x 12 interlock coils. The micro catheter was then readvanced into the gastroduodenal artery. This was embolized using first a 2 x 5 x 5 interlock vortex coil followed by a series of the 5 x 15 (2x), 6 x 20 (2x) and 6 x 10 (2x)interlock coils. Post embolization arteriography confirms absence of flow into both the super duodenum and the gastroduodenal arteries.  The micro catheter was next used to select the right gastric artery. This was coil embolized with a 2 x 3 x 2 cortex coil followed by a 3 x 12 interlock coil. The micro catheter  The accessory segment 4 artery is relatively small in fairly close to several tiny wispy branches arising directly from the right hepatic artery. The decision was made to coil embolize this in an effort to increase intrahepatic collateralization from the left hepatic artery as the patient will ultimately require both the right and left hepatic treatment. Therefore, the micro catheter was carefully advanced into this  artery which was coil embolized with a combination of a 2 x 4 followed by a 3 x 6 mm interlock soft coils.  The micro catheter was removed. A left anterior oblique arteriogram was performed through the 5 French catheter outlining the common origin of the left gastric and left hepatic arteries. The micro catheter was reintroduced into this common origin vessel  and an arteriogram was performed. The left gastric artery is robust. Cross-filling with the right gastric artery is identified. Cross-filling stops at the coil pack from the recent embolization. There are several small gastric branches arising from the most proximal left hepatic artery. The micro catheter was advanced into the more distal left hepatic artery beyond these branches and an arteriogram was performed in multiple obliquities. No evidence of gastric or other extrahepatic branches. Tumor blush is identified.  Approximately 25% of the dose of technetium MAA was injected into the left hepatic artery. The micro catheter was flushed and then navigated back into the right hepatic artery. The remaining 75% of the technetium MAA dose was injected into the right hepatic artery.  The micro catheter and 5 French catheter were removed. A limited right common femoral arteriogram was performed confirming right common femoral arterial access. Hemostasis was then attained with the assistance of a Cordis ExoSeal extra arterial closure device.  COMPLICATIONS: None  IMPRESSION: 1. Successful pre Y90 planning study. 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 Additionally, the cystic artery arises from a supraduodenal artery which shares a common origin with the gastroduodenal artery. Finally, there are some small accessory segment 4 branches which arises directly from the proximal right hepatic artery. 2. Successful coil embolization of the super duodenum artery, gastroduodenal artery, right gastric artery and  the more proximal accessory segment 4 branch artery. 3. Successful injection of technetium MAA with 25% into the left hepatic artery and 75% into the right hepatic artery.  PLAN: 1. Return in 1-2 weeks for left lobar treatment. 2. Four weeks following the left lobar treatment patient will return for right lobar treatment.  Signed,  Criselda Peaches, MD  [facility]  [facility]iology   Electronically Signed   By: Jacqulynn Cadet M.D.   On: 07/24/2014 16:56   Ir Angiogram Selective Each Additional Vessel  07/24/2014   CLINICAL DATA:  54 year old male with metastatic colorectal cancer. He presents today for his pre Y 90 mapping study, possible branch artery embolization and injection of Tc-MAA to evaluate the lung shunt fraction.  EXAM: IR EMBO ARTERIAL NOT HEMORR HEMANG INC GUIDE ROADMAPPING; ADDITIONAL ARTERIOGRAPHY; SELECTIVE VISCERAL ARTERIOGRAPHY; IR ULTRASOUND GUIDANCE VASC ACCESS RIGHT  Date: 07/24/2014  PROCEDURE: 1. Ultrasound-guided puncture of the right common femoral artery 2. Catheterization of the superior mesenteric artery with arteriogram 3. Catheterization of the celiac artery with arteriogram 4. Catheterization of the common hepatic artery with arteriogram 5. Catheterization of a supraduodenal artery with arteriogram 6. Coil embolization of supraduodenal artery 7. Catheterization of the gastroduodenal artery with arteriogram 8. Coil embolization of the gastroduodenal artery 9. Catheterization of the right gastric artery with arteriogram 10. Coil embolization of the right gastric artery 11. Catheterization of a small accessory middle hepatic artery 12. Coil embolization of the small accessory middle hepatic artery 13. Catheterization of the common trunk of the left gastric and left hepatic artery with arteriogram 14. Catheterization of the left hepatic artery with arteriogram 15. Injection of Tc-MAA into the left hepatic artery 16. Re- catheterization of  the right hepatic artery 17. Injection of macro aggregated albumin into the right hepatic artery 18. Limited right common femoral arteriogram 19. Application of Cordis ExoSeal device Interventional Radiologist:  Criselda Peaches, MD  ANESTHESIA/SEDATION: Moderate (conscious) sedation was used. 8 mg Versed, 200 mcg Fentanyl were administered intravenously. The patient's vital signs were monitored continuously by radiology nursing throughout the  procedure.  Sedation Time: 123 minutes  MEDICATIONS: None additional  FLUOROSCOPY TIME:  33 minutes  5411 mGy  CONTRAST:  180 mL Omnipaque 350  TECHNIQUE: Informed consent was obtained from the patient following explanation of the procedure, risks, benefits and alternatives. The patient understands, agrees and consents for the procedure. All questions were addressed. A time out was performed.  Maximal barrier sterile technique utilized including caps, mask, sterile gowns, sterile gloves, large sterile drape, hand hygiene, and Betadine skin prep.  The right groin was interrogated with ultrasound. The common femoral artery is widely patent. An image was obtained and stored for the medical record. Local anesthesia was attained by infiltration with 1% lidocaine. A small dermatotomy was made. Under real-time sonographic guidance, the mid common femoral artery was punctured with a 21 gauge micropuncture needle. An image was obtained and stored.  With the assistance of a 4 [place] transitional micro sheath, the initial 0.018 inch wire was exchanged for a 0.035 inch Bentson wire. The micro sheath was then exchanged for a working 5 [place] vascular sheath. A C2 cobra catheter was advanced into the abdominal aorta. The catheter was used to select the superior mesenteric artery. A superior mesenteric arteriogram was performed in carried through the portal venous phase. There is no evidence of replaced or accessory right hepatic artery. The portal vein is widely patent with normal  hepatopetal flow.  The C2 cobra catheter was then exchanged for an RC2 catheter which was used to select the celiac artery. A celiac arteriogram was then performed. The left hepatic artery is replaced to the left gastric artery. The right gastric artery is visible in appears to arise just distal to the origin of the gastroduodenal artery. There is a prominent super duodenum branch which may share a common origin with the gastroduodenal artery.  The micro catheter was then advanced into the common hepatic artery and a hepatic arteriogram was performed confirming the above findings. The micro catheter was next advanced into the super duodenum artery which does arise from a common origin with the gastroduodenal artery. This provides extensive flow to the first and second portions of the duodenum. Interestingly, the cystic artery arises from this branch. The decision was made to coil embolize this artery. Coil embolization was performed using first a 2 x 3 x 2 interlocked cortex coil followed by 3 x 6, and 3 x 12 interlock coils. The micro catheter was then readvanced into the gastroduodenal artery. This was embolized using first a 2 x 5 x 5 interlock vortex coil followed by a series of the 5 x 15 (2x), 6 x 20 (2x) and 6 x 10 (2x)interlock coils. Post embolization arteriography confirms absence of flow into both the super duodenum and the gastroduodenal arteries.  The micro catheter was next used to select the right gastric artery. This was coil embolized with a 2 x 3 x 2 cortex coil followed by a 3 x 12 interlock coil. The micro catheter  The accessory segment 4 artery is relatively small in fairly close to several tiny wispy branches arising directly from the right hepatic artery. The decision was made to coil embolize this in an effort to increase intrahepatic collateralization from the left hepatic artery as the patient will ultimately require both the right and left hepatic treatment. Therefore, the micro catheter  was carefully advanced into this artery which was coil embolized with a combination of a 2 x 4 followed by a 3 x 6 mm interlock soft coils.  The  micro catheter was removed. A left anterior oblique arteriogram was performed through the 5 French catheter outlining the common origin of the left gastric and left hepatic arteries. The micro catheter was reintroduced into this common origin vessel and an arteriogram was performed. The left gastric artery is robust. Cross-filling with the right gastric artery is identified. Cross-filling stops at the coil pack from the recent embolization. There are several small gastric branches arising from the most proximal left hepatic artery. The micro catheter was advanced into the more distal left hepatic artery beyond these branches and an arteriogram was performed in multiple obliquities. No evidence of gastric or other extrahepatic branches. Tumor blush is identified.  Approximately 25% of the dose of technetium MAA was injected into the left hepatic artery. The micro catheter was flushed and then navigated back into the right hepatic artery. The remaining 75% of the technetium MAA dose was injected into the right hepatic artery.  The micro catheter and 5 French catheter were removed. A limited right common femoral arteriogram was performed confirming right common femoral arterial access. Hemostasis was then attained with the assistance of a Cordis ExoSeal extra arterial closure device.  COMPLICATIONS: None  IMPRESSION: 1. Successful pre Y90 planning study. 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 Additionally, the cystic artery arises from a supraduodenal artery which shares a common origin with the gastroduodenal artery. Finally, there are some small accessory segment 4 branches which arises directly from the proximal right hepatic artery. 2. Successful coil embolization of the super duodenum artery, gastroduodenal  artery, right gastric artery and the more proximal accessory segment 4 branch artery. 3. Successful injection of technetium MAA with 25% into the left hepatic artery and 75% into the right hepatic artery.  PLAN: 1. Return in 1-2 weeks for left lobar treatment. 2. Four weeks following the left lobar treatment patient will return for right lobar treatment.  Signed,  Criselda Peaches, MD  [facility]  [facility]   Electronically Signed   By: Jacqulynn Cadet M.D.   On: 07/24/2014 16:56   Ir US Guide Vasc Access Right  07/24/2014   CLINICAL DATA:  54 year old male with metastatic colorectal cancer. He presents today for his pre Y 90 mapping study, possible branch artery embolization and injection of Tc-MAA to evaluate the lung shunt fraction.  EXAM: IR EMBO ARTERIAL NOT HEMORR HEMANG INC GUIDE ROADMAPPING; ADDITIONAL ARTERIOGRAPHY; SELECTIVE VISCERAL ARTERIOGRAPHY; IR ULTRASOUND GUIDANCE VASC ACCESS RIGHT  Date: 07/24/2014  PROCEDURE: 1. Ultrasound-guided puncture of the right common femoral artery 2. Catheterization of the superior mesenteric artery with arteriogram 3. Catheterization of the celiac artery with arteriogram 4. Catheterization of the common hepatic artery with arteriogram 5. Catheterization of a supraduodenal artery with arteriogram 6. Coil embolization of supraduodenal artery 7. Catheterization of the gastroduodenal artery with arteriogram 8. Coil embolization of the gastroduodenal artery 9. Catheterization of the right gastric artery with arteriogram 10. Coil embolization of the right gastric artery 11. Catheterization of a small accessory middle hepatic artery 12. Coil embolization of the small accessory middle hepatic artery 13. Catheterization of the common trunk of the left gastric and left hepatic artery with arteriogram 14. Catheterization of the left hepatic artery with arteriogram 15. Injection of Tc-MAA into the left hepatic artery 16. Re-  catheterization of the right hepatic artery 17. Injection of macro aggregated albumin into the right hepatic artery 18. Limited right common femoral arteriogram 19. Application of  Cordis ExoSeal device Interventional Radiologist:  Criselda Peaches, MD  ANESTHESIA/SEDATION: Moderate (conscious) sedation was used. 8 mg Versed, 200 mcg Fentanyl were administered intravenously. The patient's vital signs were monitored continuously by radiology nursing throughout the procedure.  Sedation Time: 123 minutes  MEDICATIONS: None additional  FLUOROSCOPY TIME:  33 minutes  5411 mGy  CONTRAST:  180 mL Omnipaque 350  TECHNIQUE: Informed consent was obtained from the patient following explanation of the procedure, risks, benefits and alternatives. The patient understands, agrees and consents for the procedure. All questions were addressed. A time out was performed.  Maximal barrier sterile technique utilized including caps, mask, sterile gowns, sterile gloves, large sterile drape, hand hygiene, and Betadine skin prep.  The right groin was interrogated with ultrasound. The common femoral artery is widely patent. An image was obtained and stored for the medical record. Local anesthesia was attained by infiltration with 1% lidocaine. A small dermatotomy was made. Under real-time sonographic guidance, the mid common femoral artery was punctured with a 21 gauge micropuncture needle. An image was obtained and stored.  With the assistance of a 4 [place] transitional micro sheath, the initial 0.018 inch wire was exchanged for a 0.035 inch Bentson wire. The micro sheath was then exchanged for a working 5 [place] vascular sheath. A C2 cobra catheter was advanced into the abdominal aorta. The catheter was used to select the superior mesenteric artery. A superior mesenteric arteriogram was performed in carried through the portal venous phase. There is no evidence of replaced or accessory right hepatic artery. The portal vein is widely patent  with normal hepatopetal flow.  The C2 cobra catheter was then exchanged for an RC2 catheter which was used to select the celiac artery. A celiac arteriogram was then performed. The left hepatic artery is replaced to the left gastric artery. The right gastric artery is visible in appears to arise just distal to the origin of the gastroduodenal artery. There is a prominent super duodenum branch which may share a common origin with the gastroduodenal artery.  The micro catheter was then advanced into the common hepatic artery and a hepatic arteriogram was performed confirming the above findings. The micro catheter was next advanced into the super duodenum artery which does arise from a common origin with the gastroduodenal artery. This provides extensive flow to the first and second portions of the duodenum. Interestingly, the cystic artery arises from this branch. The decision was made to coil embolize this artery. Coil embolization was performed using first a 2 x 3 x 2 interlocked cortex coil followed by 3 x 6, and 3 x 12 interlock coils. The micro catheter was then readvanced into the gastroduodenal artery. This was embolized using first a 2 x 5 x 5 interlock vortex coil followed by a series of the 5 x 15 (2x), 6 x 20 (2x) and 6 x 10 (2x)interlock coils. Post embolization arteriography confirms absence of flow into both the super duodenum and the gastroduodenal arteries.  The micro catheter was next used to select the right gastric artery. This was coil embolized with a 2 x 3 x 2 cortex coil followed by a 3 x 12 interlock coil. The micro catheter  The accessory segment 4 artery is relatively small in fairly close to several tiny wispy branches arising directly from the right hepatic artery. The decision was made to coil embolize this in an effort to increase intrahepatic collateralization from the left hepatic artery as the patient will ultimately require both the right and  left hepatic treatment. Therefore, the  micro catheter was carefully advanced into this artery which was coil embolized with a combination of a 2 x 4 followed by a 3 x 6 mm interlock soft coils.  The micro catheter was removed. A left anterior oblique arteriogram was performed through the 5 French catheter outlining the common origin of the left gastric and left hepatic arteries. The micro catheter was reintroduced into this common origin vessel and an arteriogram was performed. The left gastric artery is robust. Cross-filling with the right gastric artery is identified. Cross-filling stops at the coil pack from the recent embolization. There are several small gastric branches arising from the most proximal left hepatic artery. The micro catheter was advanced into the more distal left hepatic artery beyond these branches and an arteriogram was performed in multiple obliquities. No evidence of gastric or other extrahepatic branches. Tumor blush is identified.  Approximately 25% of the dose of technetium MAA was injected into the left hepatic artery. The micro catheter was flushed and then navigated back into the right hepatic artery. The remaining 75% of the technetium MAA dose was injected into the right hepatic artery.  The micro catheter and 5 French catheter were removed. A limited right common femoral arteriogram was performed confirming right common femoral arterial access. Hemostasis was then attained with the assistance of a Cordis ExoSeal extra arterial closure device.  COMPLICATIONS: None  IMPRESSION: 1. Successful pre Y90 planning study. 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 Additionally, the cystic artery arises from a supraduodenal artery which shares a common origin with the gastroduodenal artery. Finally, there are some small accessory segment 4 branches which arises directly from the proximal right hepatic artery. 2. Successful coil embolization of the super duodenum artery,  gastroduodenal artery, right gastric artery and the more proximal accessory segment 4 branch artery. 3. Successful injection of technetium MAA with 25% into the left hepatic artery and 75% into the right hepatic artery.  PLAN: 1. Return in 1-2 weeks for left lobar treatment. 2. Four weeks following the left lobar treatment patient will return for right lobar treatment.  Signed,  Criselda Peaches, MD  [facility]  [facility] Radiology   Electronically Signed   By: Jacqulynn Cadet M.D.   On: 07/24/2014 16:56   Ir Embo Arterial Not [place]  07/24/2014   CLINICAL DATA:  54 year old male with metastatic colorectal cancer. He presents today for his pre Y 90 mapping study, possible branch artery embolization and injection of Tc-MAA to evaluate the lung shunt fraction.  EXAM: IR EMBO ARTERIAL NOT HEMORR HEMANG INC GUIDE ROADMAPPING; ADDITIONAL ARTERIOGRAPHY; SELECTIVE VISCERAL ARTERIOGRAPHY; IR ULTRASOUND GUIDANCE VASC ACCESS RIGHT  Date: 07/24/2014  PROCEDURE: 1. Ultrasound-guided puncture of the right common femoral artery 2. Catheterization of the superior mesenteric artery with arteriogram 3. Catheterization of the celiac artery with arteriogram 4. Catheterization of the common hepatic artery with arteriogram 5. Catheterization of a supraduodenal artery with arteriogram 6. Coil embolization of supraduodenal artery 7. Catheterization of the gastroduodenal artery with arteriogram 8. Coil embolization of the gastroduodenal artery 9. Catheterization of the right gastric artery with arteriogram 10. Coil embolization of the right gastric artery 11. Catheterization of a small accessory middle hepatic artery 12. Coil embolization of the small accessory middle hepatic artery 13. Catheterization of the common trunk of the left gastric and left hepatic artery with arteriogram 14. Catheterization of the left hepatic  artery with arteriogram 15. Injection of  Tc-MAA into the left hepatic artery 16. Re- catheterization of the right hepatic artery 17. Injection of macro aggregated albumin into the right hepatic artery 18. Limited right common femoral arteriogram 19. Application of Cordis ExoSeal device Interventional Radiologist:  Criselda Peaches, MD  ANESTHESIA/SEDATION: Moderate (conscious) sedation was used. 8 mg Versed, 200 mcg Fentanyl were administered intravenously. The patient's vital signs were monitored continuously by radiology nursing throughout the procedure.  Sedation Time: 123 minutes  MEDICATIONS: None additional  FLUOROSCOPY TIME:  33 minutes  5411 mGy  CONTRAST:  180 mL Omnipaque 350  TECHNIQUE: Informed consent was obtained from the patient following explanation of the procedure, risks, benefits and alternatives. The patient understands, agrees and consents for the procedure. All questions were addressed. A time out was performed.  Maximal barrier sterile technique utilized including caps, mask, sterile gowns, sterile gloves, large sterile drape, hand hygiene, and Betadine skin prep.  The right groin was interrogated with ultrasound. The common femoral artery is widely patent. An image was obtained and stored for the medical record. Local anesthesia was attained by infiltration with 1% lidocaine. A small dermatotomy was made. Under real-time sonographic guidance, the mid common femoral artery was punctured with a 21 gauge micropuncture needle. An image was obtained and stored.  With the assistance of a 4 [place] transitional micro sheath, the initial 0.018 inch wire was exchanged for a 0.035 inch Bentson wire. The micro sheath was then exchanged for a working 5 [place] vascular sheath. A C2 cobra catheter was advanced into the abdominal aorta. The catheter was used to select the superior mesenteric artery. A superior mesenteric arteriogram was performed in carried through the portal venous phase. There is no evidence of replaced or accessory right  hepatic artery. The portal vein is widely patent with normal hepatopetal flow.  The C2 cobra catheter was then exchanged for an RC2 catheter which was used to select the celiac artery. A celiac arteriogram was then performed. The left hepatic artery is replaced to the left gastric artery. The right gastric artery is visible in appears to arise just distal to the origin of the gastroduodenal artery. There is a prominent super duodenum branch which may share a common origin with the gastroduodenal artery.  The micro catheter was then advanced into the common hepatic artery and a hepatic arteriogram was performed confirming the above findings. The micro catheter was next advanced into the super duodenum artery which does arise from a common origin with the gastroduodenal artery. This provides extensive flow to the first and second portions of the duodenum. Interestingly, the cystic artery arises from this branch. The decision was made to coil embolize this artery. Coil embolization was performed using first a 2 x 3 x 2 interlocked cortex coil followed by 3 x 6, and 3 x 12 interlock coils. The micro catheter was then readvanced into the gastroduodenal artery. This was embolized using first a 2 x 5 x 5 interlock vortex coil followed by a series of the 5 x 15 (2x), 6 x 20 (2x) and 6 x 10 (2x)interlock coils. Post embolization arteriography confirms absence of flow into both the super duodenum and the gastroduodenal arteries.  The micro catheter was next used to select the right gastric artery. This was coil embolized with a 2 x 3 x 2 cortex coil followed by a 3 x 12 interlock coil. The micro catheter  The accessory segment 4 artery is relatively small in fairly close to  several tiny wispy branches arising directly from the right hepatic artery. The decision was made to coil embolize this in an effort to increase intrahepatic collateralization from the left hepatic artery as the patient will ultimately require both the  right and left hepatic treatment. Therefore, the micro catheter was carefully advanced into this artery which was coil embolized with a combination of a 2 x 4 followed by a 3 x 6 mm interlock soft coils.  The micro catheter was removed. A left anterior oblique arteriogram was performed through the 5 French catheter outlining the common origin of the left gastric and left hepatic arteries. The micro catheter was reintroduced into this common origin vessel and an arteriogram was performed. The left gastric artery is robust. Cross-filling with the right gastric artery is identified. Cross-filling stops at the coil pack from the recent embolization. There are several small gastric branches arising from the most proximal left hepatic artery. The micro catheter was advanced into the more distal left hepatic artery beyond these branches and an arteriogram was performed in multiple obliquities. No evidence of gastric or other extrahepatic branches. Tumor blush is identified.  Approximately 25% of the dose of technetium MAA was injected into the left hepatic artery. The micro catheter was flushed and then navigated back into the right hepatic artery. The remaining 75% of the technetium MAA dose was injected into the right hepatic artery.  The micro catheter and 5 French catheter were removed. A limited right common femoral arteriogram was performed confirming right common femoral arterial access. Hemostasis was then attained with the assistance of a Cordis ExoSeal extra arterial closure device.  COMPLICATIONS: None  IMPRESSION: 1. Successful pre Y90 planning study. 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 Additionally, the cystic artery arises from a supraduodenal artery which shares a common origin with the gastroduodenal artery. Finally, there are some small accessory segment 4 branches which arises directly from the proximal right hepatic artery. 2. Successful  coil embolization of the super duodenum artery, gastroduodenal artery, right gastric artery and the more proximal accessory segment 4 branch artery. 3. Successful injection of technetium MAA with 25% into the left hepatic artery and 75% into the right hepatic artery.  PLAN: 1. Return in 1-2 weeks for left lobar treatment. 2. Four weeks following the left lobar treatment patient will return for right lobar treatment.  Signed,  Criselda Peaches, MD  [facility]  [facility]   Electronically Signed   By: Jacqulynn Cadet M.D.   On: 07/24/2014 16:56   Nm Fusion  07/24/2014   CLINICAL DATA:  Colorectal carcinoma with unresectable liver metastasis. Pre Yttrium 90 evaluation.  EXAM: NUCLEAR MEDICINE LIVER SCAN; 3D fusion  TECHNIQUE: Abdominal images were obtained in multiple projections after intrahepatic arterial injection of radiopharmaceutical. SPECT imaging was performed. Lung shunt calculation was performed. CT fusion was performed.  RADIOPHARMACEUTICALS:  4MILLI CURIE MAA TECHNETIUM TO 76M ALBUMIN AGGREGATED  COMPARISON:  CT 06/09/2014  FINDINGS: The injected microaggregated albumin localizes within the liver. No evidence of activity within the stomach, duodenum, or bowel.  Calculated shunt fraction to the lungs equals 5%.  IMPRESSION: 1. No significant extrahepatic radiotracer activity following intrahepatic arterial injection of MAA. 2. Lung shunt fraction equals 5%   Electronically Signed   By: Suzy Bouchard M.D.   On: 07/24/2014 16:54    Labs:  CBC:  Recent Labs  07/24/14 0739 08/06/14 1152  WBC 3.9* 6.1  HGB 12.5* 13.4  HCT 38.3* 41.6  PLT 225 451*    COAGS:  Recent Labs  07/24/14 0739 08/06/14 0745  INR 0.91 0.89  APTT 28 31    BMP:  Recent Labs  07/24/14 0739 07/24/14 0755  NA 140 141  K 4.1 4.2  CL 106 105  CO2 28 27  GLUCOSE 92 84  BUN 14 13  CALCIUM 9.0 9.3  CREATININE 0.86 0.88  GFRNONAA >90 >90  GFRAA >90  >90    LIVER FUNCTION TESTS:  Recent Labs  07/24/14 0755  BILITOT 0.4  AST 43*  ALT 31  ALKPHOS 86  PROT 6.9  ALBUMIN 3.9    TUMOR MARKERS: No results for input(s): AFPTM, CEA, CA199, CHROMGRNA in the last 8760 hours.  Assessment and Plan: Marcus Buck is a 54 y.o. 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 Surveillance imaging in January 2015 demonstrated progression of hepatic metastatic disease, therefore he was transitioned to FOLFIRI plus Avastin and is now status post 26 treatments.Imaging obtained on 06/09/2014 again demonstrated progression of his hepatic metastatic disease. His Avastin was held at his last treatment session. He has been seen on 07/01/14 in consult to discuss further options regarding metastatic disease in his liver. He presents today for hepatic Y-90 radioembolization. Details/risks of procedure (including ,but not limited to ,internal bleeding, infection, renal failure, nontarget embolization) d/w pt with his understanding and consent.   Thank you for this interesting consult.  I greatly enjoyed meeting Galvin Aversa and look forward to participating in their care.  Signed: Autumn Messing 08/06/2014, 8:44 AM   I spent a total of 25 minutes face to face in clinical consultation, greater than 50% of which was counseling/coordinating care for hepatic Y-90 radioembolization

## 2014-08-06 NOTE — Procedures (Signed)
Interventional Radiology Procedure Note  Procedure: Left lobar TARE with Y90.  Access: Right CFA, 51F Closure: ExoSeal Complications: None Recommendations: - Bedrest x 4 hrs - Carafate today and then TID with meals - Protonix  - Prednisone dose pack - Oxy  Signed,  Criselda Peaches, MD

## 2014-08-06 NOTE — Discharge Instructions (Signed)
Post Y-90 Radioembolization Discharge Instructions  You have been given a radioactive material during your procedure.  While it is safe for you to be discharged home from the hospital, you need to proceed directly home.    Do not use public transportation, including air travel, lasting more than 2 hours for 1 week.  Avoid crowded public places for 1 week.  Adult visitors should try to avoid close contact with you for 1 week.    Children and pregnant females should not visit or have close contact with you for 1 week.  Items that you touch are not radioactive.  Do not sleep in the same bed as your partner for 1 week, and a condom should be used for sexual activity during the first 24 hours.  Your blood may be radioactive and caution should be used if any bleeding occurs during the recovery period.  Body fluids may be radioactive for 24 hours.  Wash your hands after voiding.  Men should sit to urinate.  Dispose of any soiled materials (flush down toilet or place in trash at home) during the first day.  Drink 6 to 8 glasses of fluids per day for 5 days to hydrate yourself.  If you need to see a doctor during the first week, you must let them know that you were treated with yttrium-90 microspheres, and will be slightly radioactive.  They can call Interventional Radiology [phone] with any questions.   Conscious Sedation Sedation is the use of medicines to promote relaxation and relieve discomfort and anxiety. Conscious sedation is a type of sedation. Under conscious sedation you are less alert than normal but are still able to respond to instructions or stimulation. Conscious sedation is used during short medical and dental procedures. It is milder than deep sedation or general anesthesia and allows you to return to your regular activities sooner.  LET [facility] CARE PROVIDER KNOW ABOUT:   Any allergies you have.  All medicines you are taking, including vitamins, herbs, eye drops, creams,  and over-the-counter medicines.  Use of steroids (by mouth or creams).  Previous problems you or members of your family have had with the use of anesthetics.  Any blood disorders you have.  Previous surgeries you have had.  Medical conditions you have.  Possibility of pregnancy, if this applies.  Use of cigarettes, alcohol, or illegal drugs. RISKS AND COMPLICATIONS Generally, this is a safe procedure. However, as with any procedure, problems can occur. Possible problems include:  Oversedation.  Trouble breathing on your own. You may need to have a breathing tube until you are awake and breathing on your own.  Allergic reaction to any of the medicines used for the procedure. BEFORE THE PROCEDURE  You may have blood tests done. These tests can help show how well your kidneys and liver are working. They can also show how well your blood clots.  A physical exam will be done.  Only take medicines as directed by your health care provider. You may need to stop taking medicines (such as blood thinners, aspirin, or nonsteroidal anti-inflammatory drugs) before the procedure.   Do not eat or drink at least 6 hours before the procedure or as directed by your health care provider.  Arrange for a responsible adult, family member, or friend to take you home after the procedure. He or she should stay with you for at least 24 hours after the procedure, until the medicine has worn off. PROCEDURE   An intravenous (IV) catheter will be  inserted into one of your veins. Medicine will be able to flow directly into your body through this catheter. You may be given medicine through this tube to help prevent pain and help you relax.  The medical or dental procedure will be done. AFTER THE PROCEDURE  You will stay in a recovery area until the medicine has worn off. Your blood pressure and pulse will be checked.   Depending on the procedure you had, you may be allowed to go home when you can  tolerate liquids and your pain is under control. Document Released: 02/15/2001 Document Revised: 05/28/2013 Document Reviewed: 01/28/2013 [facility] Patient Information 2015 [place], [place]. This information is not intended to replace advice given to you by your health care provider. Make sure you discuss any questions you have with your health care provider.

## 2014-08-06 NOTE — Sedation Documentation (Signed)
5 Fr sheath removed from right groin br Dr. Laurence Ferrari. Hemostasis achieved used exoseal closure device. RDP +1, groin level 0.

## 2014-08-07 ENCOUNTER — Encounter: Payer: Self-pay | Admitting: Radiology

## 2014-08-07 ENCOUNTER — Other Ambulatory Visit: Payer: Self-pay | Admitting: *Deleted

## 2014-08-07 ENCOUNTER — Encounter: Payer: Self-pay | Admitting: *Deleted

## 2014-08-07 DIAGNOSIS — C787 Secondary malignant neoplasm of liver and intrahepatic bile duct: Secondary | ICD-10-CM

## 2014-08-07 LAB — AFP TUMOR MARKER: AFP-Tumor Marker: 1 ng/mL (ref 0.0–8.3)

## 2014-08-07 LAB — CEA: CEA: 171.7 ng/mL — AB (ref 0.0–4.7)

## 2014-08-08 ENCOUNTER — Other Ambulatory Visit: Payer: Self-pay | Admitting: Interventional Radiology

## 2014-08-08 DIAGNOSIS — C189 Malignant neoplasm of colon, unspecified: Secondary | ICD-10-CM

## 2014-08-08 DIAGNOSIS — C787 Secondary malignant neoplasm of liver and intrahepatic bile duct: Principal | ICD-10-CM

## 2014-08-21 ENCOUNTER — Ambulatory Visit
Admission: RE | Admit: 2014-08-21 | Discharge: 2014-08-21 | Disposition: A | Discharge status: Home / Self Care | Payer: Medicare Other | Source: Ambulatory Visit | Attending: Interventional Radiology | Admitting: Interventional Radiology

## 2014-08-21 DIAGNOSIS — C787 Secondary malignant neoplasm of liver and intrahepatic bile duct: Principal | ICD-10-CM

## 2014-08-21 DIAGNOSIS — C189 Malignant neoplasm of colon, unspecified: Secondary | ICD-10-CM

## 2014-08-21 HISTORY — PX: IR GENERIC HISTORICAL: IMG1180011

## 2014-08-21 NOTE — Progress Notes (Signed)
Chief Complaint: Chief Complaint  Patient presents with  . Follow-up    2 wk follow up Y-90 SIRT    Referring Physician(s): McCullough,Heath  History of Present Illness: Marcus Buck is a 54 y.o. 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 He is currently status post 45 palliative treatments of systemic chemotherapy with FOLFOX plus Avastin. Surveillance imaging in January 2015 demonstrated progression of hepatic metastatic disease, therefore he was transitioned to FOLFIRI plus Avastin and is now status post 26 treatments.   Initial response to second line therapy was positive with initial response to therapy followed by stable disease. However, continued surveillance imaging obtained on 06/09/2014 again demonstrated progression of his hepatic metastatic disease. His Avastin was held at his last treatment session.   He underwent transarterial radioembolization of the right lobar hepatic artery with Y90 microspheres on 08/06/2014 and presents today for his 2 week follow-up evaluation.  Marcus Buck is doing remarkably well. He notes that he had some mild nausea the first few days following the procedure but has not experienced any pain, fever, chills or decreased appetite. His appetite remains good. He had labs drawn last week in [facility] which I reviewed. His bilirubin remains normal at 0.4. No significant elevation of transaminases. CBC and renal function are also good.  Past Medical History  Diagnosis Date  . Hypertension   . Anemia   . Palliative chemotherapy underway   . Cancer     Stage IV Metastatic adenocarcinoma of Liver, primary rectum    No past surgical history on file.  Allergies: Review of patient's allergies indicates no known allergies.  Medications: Prior to Admission  medications   Medication Sig Start Date End Date Taking? Authorizing Provider  acetaminophen (TYLENOL) 500 MG tablet Take 500 mg by mouth every 6 (six) hours as needed for fever.   Yes Historical Provider, MD  amLODipine (NORVASC) 5 MG tablet Take 5 mg by mouth 2 (two) times daily.   Yes Historical Provider, MD  lisinopril (PRINIVIL,ZESTRIL) 20 MG tablet Take 20 mg by mouth 2 (two) times daily.   Yes Historical Provider, MD     No family history on file.  History   Social History  . Marital Status: Married    Spouse Name: N/A  . Number of Children: N/A  . Years of Education: N/A   Social History Main Topics  . Smoking status: Former Smoker -- 0.26 packs/day    Types: Cigarettes    Start date: 07/01/1974    Quit date: 07/01/1988  . Smokeless tobacco: Not on file  . Alcohol Use: 3.6 oz/week    6 Cans of beer per week  . Drug Use: No  . Sexual Activity: Not on file   Other Topics Concern  . Not on file   Social History Narrative    ECOG Status: 0 - Asymptomatic  Review of Systems: A 12 point ROS discussed and pertinent positives are indicated in the HPI above.  All other systems are negative.  Review of Systems  Vital Signs: BP 133/92 mmHg  Pulse 83  Temp(Src) 98.2 F (36.8 C) (Oral)  Resp 14  Ht 5' 11"  (1.803 m)  Wt 170 lb (77.111 kg)  BMI 23.72 kg/m2  SpO2 100%  Physical Exam  Constitutional: He is oriented to person, place, and time. He appears  well-developed and well-nourished. No distress.  HENT:  Head: Normocephalic and atraumatic.  Eyes: No scleral icterus.  Cardiovascular: Normal rate.   Pulmonary/Chest: Effort normal.  Neurological: He is alert and oriented to person, place, and time.  Skin: Skin is warm and dry.  Psychiatric: He has a normal mood and affect. His behavior is normal.  Nursing note and vitals reviewed.   Mallampati Score:  2  Imaging:   Labs:  CBC:  Recent Labs  07/24/14 0739 08/06/14 1152  WBC 3.9* 6.1  HGB 12.5*  13.4  HCT 38.3* 41.6  PLT 225 451*    COAGS:  Recent Labs  07/24/14 0739 08/06/14 0745  INR 0.91 0.89  APTT 28 31    BMP:  Recent Labs  07/24/14 0739 07/24/14 0755 08/06/14 0745  NA 140 141 135  K 4.1 4.2 4.4  CL 106 105 102  CO2 28 27 26   GLUCOSE 92 84 103*  BUN 14 13 12   CALCIUM 9.0 9.3 9.1  CREATININE 0.86 0.88 0.77  GFRNONAA >90 >90 >90  GFRAA >90 >90 >90    LIVER FUNCTION TESTS:  Recent Labs  07/24/14 0755 08/06/14 0745  BILITOT 0.4 0.5  AST 43* 72*  ALT 31 62*  ALKPHOS 86 191*  PROT 6.9 7.9  ALBUMIN 3.9 3.8    TUMOR MARKERS:  Recent Labs  08/06/14 0745  AFPTM 1.0  CEA 171.7*    Assessment and Plan:  Mr. Ledo is doing very well 2 weeks status post transarterial radial embolization with Y 90 microspheres of his right hepatic lobe. He has no active issues and his labs from last week looked good with a normal bilirubin of 0.4.  He is all set to proceed with left lobar treatment at the end of the month on 09/02/14.  He can resume chemotherapy at any time.   SignedJacqulynn Cadet 08/21/2014, 9:19 AM   I spent a total of  10 Minutes in face to face in clinical consultation, greater than 50% of which was counseling/coordinating care for colorectal cancer metastatic to liver.

## 2014-08-29 ENCOUNTER — Other Ambulatory Visit: Payer: Self-pay | Admitting: Radiology

## 2014-09-02 ENCOUNTER — Other Ambulatory Visit: Payer: Self-pay | Admitting: Interventional Radiology

## 2014-09-02 ENCOUNTER — Encounter (HOSPITAL_COMMUNITY)
Admission: RE | Admit: 2014-09-02 | Discharge: 2014-09-02 | Disposition: A | Discharge status: Home / Self Care | Payer: Medicare Other | Source: Ambulatory Visit | Attending: Interventional Radiology | Admitting: Interventional Radiology

## 2014-09-02 ENCOUNTER — Encounter (HOSPITAL_COMMUNITY): Payer: Self-pay

## 2014-09-02 ENCOUNTER — Ambulatory Visit (HOSPITAL_COMMUNITY)
Admission: RE | Admit: 2014-09-02 | Discharge: 2014-09-02 | Disposition: A | Discharge status: Home / Self Care | Payer: Medicare Other | Source: Ambulatory Visit | Attending: Interventional Radiology | Admitting: Interventional Radiology

## 2014-09-02 DIAGNOSIS — D649 Anemia, unspecified: Secondary | ICD-10-CM | POA: Insufficient documentation

## 2014-09-02 DIAGNOSIS — C189 Malignant neoplasm of colon, unspecified: Secondary | ICD-10-CM

## 2014-09-02 DIAGNOSIS — C20 Malignant neoplasm of rectum: Secondary | ICD-10-CM | POA: Diagnosis not present

## 2014-09-02 DIAGNOSIS — C19 Malignant neoplasm of rectosigmoid junction: Secondary | ICD-10-CM | POA: Insufficient documentation

## 2014-09-02 DIAGNOSIS — I10 Essential (primary) hypertension: Secondary | ICD-10-CM | POA: Diagnosis not present

## 2014-09-02 DIAGNOSIS — C787 Secondary malignant neoplasm of liver and intrahepatic bile duct: Principal | ICD-10-CM

## 2014-09-02 DIAGNOSIS — Z87891 Personal history of nicotine dependence: Secondary | ICD-10-CM | POA: Insufficient documentation

## 2014-09-02 DIAGNOSIS — Z79899 Other long term (current) drug therapy: Secondary | ICD-10-CM | POA: Diagnosis not present

## 2014-09-02 LAB — COMPREHENSIVE METABOLIC PANEL
ALK PHOS: 173 U/L — AB (ref 39–117)
ALT: 34 U/L (ref 0–53)
AST: 37 U/L (ref 0–37)
Albumin: 3.4 g/dL — ABNORMAL LOW (ref 3.5–5.2)
Anion gap: 7 (ref 5–15)
BILIRUBIN TOTAL: 0.3 mg/dL (ref 0.3–1.2)
BUN: 9 mg/dL (ref 6–23)
CALCIUM: 8.8 mg/dL (ref 8.4–10.5)
CO2: 28 mmol/L (ref 19–32)
CREATININE: 0.77 mg/dL (ref 0.50–1.35)
Chloride: 104 mmol/L (ref 96–112)
GFR calc Af Amer: >90 mL/min (ref >90)
GLUCOSE: 97 mg/dL (ref 70–99)
Potassium: 4.1 mmol/L (ref 3.5–5.1)
Sodium: 139 mmol/L (ref 135–145)
Total Protein: 7.1 g/dL (ref 6.0–8.3)

## 2014-09-02 LAB — APTT: APTT: 32 s (ref 24–37)

## 2014-09-02 LAB — CBC WITH DIFFERENTIAL/PLATELET
Basophils Absolute: 0 10*3/uL (ref 0.0–0.1)
Basophils Relative: 0 % (ref 0–1)
Eosinophils Absolute: 0.2 10*3/uL (ref 0.0–0.7)
Eosinophils Relative: 4 % (ref 0–5)
HEMATOCRIT: 36.7 % — AB (ref 39.0–52.0)
Hemoglobin: 11.7 g/dL — ABNORMAL LOW (ref 13.0–17.0)
Lymphocytes Relative: 8 % — ABNORMAL LOW (ref 12–46)
Lymphs Abs: 0.5 10*3/uL — ABNORMAL LOW (ref 0.7–4.0)
MCH: 28.7 pg (ref 26.0–34.0)
MCHC: 31.9 g/dL (ref 30.0–36.0)
MCV: 90 fL (ref 78.0–100.0)
MONO ABS: 1 10*3/uL (ref 0.1–1.0)
MONOS PCT: 16 % — AB (ref 3–12)
NEUTROS ABS: 4.4 10*3/uL (ref 1.7–7.7)
Neutrophils Relative %: 72 % (ref 43–77)
PLATELETS: 296 10*3/uL (ref 150–400)
RBC: 4.08 MIL/uL — AB (ref 4.22–5.81)
RDW: 14.5 % (ref 11.5–15.5)
WBC: 6.1 10*3/uL (ref 4.0–10.5)

## 2014-09-02 LAB — PROTIME-INR
INR: 0.92 (ref 0.00–1.49)
Prothrombin Time: 12.5 seconds (ref 11.6–15.2)

## 2014-09-02 MED ORDER — ONDANSETRON HCL 4 MG/2ML IJ SOLN
INTRAMUSCULAR | Status: AC
  Filled 2014-09-02: qty 2

## 2014-09-02 MED ORDER — DIPHENHYDRAMINE HCL 50 MG/ML IJ SOLN
INTRAMUSCULAR | Status: AC
  Filled 2014-09-02: qty 1

## 2014-09-02 MED ORDER — FENTANYL CITRATE 0.05 MG/ML IJ SOLN
INTRAMUSCULAR | Status: AC | PRN
  Administered 2014-09-02 (×3): 25 ug via INTRAVENOUS
  Administered 2014-09-02: 50 ug via INTRAVENOUS
  Administered 2014-09-02: 25 ug via INTRAVENOUS

## 2014-09-02 MED ORDER — MIDAZOLAM HCL 2 MG/2ML IJ SOLN
INTRAMUSCULAR | Status: AC | PRN
  Administered 2014-09-02: 0.5 mg via INTRAVENOUS
  Administered 2014-09-02: 1 mg via INTRAVENOUS
  Administered 2014-09-02 (×3): 0.5 mg via INTRAVENOUS
  Administered 2014-09-02 (×2): 1 mg via INTRAVENOUS

## 2014-09-02 MED ORDER — DIPHENHYDRAMINE HCL 50 MG/ML IJ SOLN
INTRAMUSCULAR | Status: AC
Start: 2014-09-02 — End: 2014-09-02
  Filled 2014-09-02: qty 1

## 2014-09-02 MED ORDER — SODIUM CHLORIDE 0.9 % IJ SOLN
10.0000 mL | Freq: Once | INTRAMUSCULAR | Status: AC
  Administered 2014-09-02: 10 mL

## 2014-09-02 MED ORDER — MIDAZOLAM HCL 2 MG/2ML IJ SOLN
INTRAMUSCULAR | Status: AC
  Filled 2014-09-02: qty 6

## 2014-09-02 MED ORDER — HEPARIN SOD (PORK) LOCK FLUSH 100 UNIT/ML IV SOLN
500.0000 [IU] | INTRAVENOUS | Status: AC | PRN
  Administered 2014-09-02: 500 [IU]
  Filled 2014-09-02: qty 5

## 2014-09-02 MED ORDER — PANTOPRAZOLE SODIUM 40 MG IV SOLR
40.0000 mg | Freq: Once | INTRAVENOUS | Status: AC
  Administered 2014-09-02: 40 mg via INTRAVENOUS
  Filled 2014-09-02: qty 40

## 2014-09-02 MED ORDER — IOHEXOL 300 MG/ML  SOLN
80.0000 mL | Freq: Once | INTRAMUSCULAR | Status: AC | PRN
  Administered 2014-09-02: 25 mL via INTRA_ARTERIAL

## 2014-09-02 MED ORDER — DIPHENHYDRAMINE HCL 50 MG/ML IJ SOLN
INTRAMUSCULAR | Status: AC | PRN
  Administered 2014-09-02: 25 mg via INTRAVENOUS

## 2014-09-02 MED ORDER — ONDANSETRON HCL 4 MG/2ML IJ SOLN
4.0000 mg | Freq: Once | INTRAMUSCULAR | Status: AC
  Administered 2014-09-02: 4 mg via INTRAVENOUS
  Filled 2014-09-02: qty 2

## 2014-09-02 MED ORDER — SODIUM CHLORIDE 0.9 % IV SOLN: INTRAVENOUS | Status: AC

## 2014-09-02 MED ORDER — FENTANYL CITRATE 0.05 MG/ML IJ SOLN
INTRAMUSCULAR | Status: AC
  Filled 2014-09-02: qty 4

## 2014-09-02 MED ORDER — DEXAMETHASONE SODIUM PHOSPHATE 10 MG/ML IJ SOLN
INTRAMUSCULAR | Status: AC | PRN
  Administered 2014-09-02: 10 mg via INTRAVENOUS

## 2014-09-02 MED ORDER — SODIUM CHLORIDE 0.9 % IV SOLN
INTRAVENOUS | Status: DC
  Administered 2014-09-02: 500 mL via INTRAVENOUS

## 2014-09-02 MED ORDER — HEPARIN SOD (PORK) LOCK FLUSH 100 UNIT/ML IV SOLN: 500.0000 [IU] | INTRAVENOUS | Status: DC

## 2014-09-02 MED ORDER — LIDOCAINE HCL 1 % IJ SOLN
INTRAMUSCULAR | Status: AC
  Filled 2014-09-02: qty 20

## 2014-09-02 MED ORDER — HEPARIN SOD (PORK) LOCK FLUSH 100 UNIT/ML IV SOLN: 500.0000 [IU] | INTRAVENOUS | Status: DC | PRN

## 2014-09-02 MED ORDER — EPINEPHRINE HCL 0.1 MG/ML IJ SOSY
PREFILLED_SYRINGE | INTRAMUSCULAR | Status: AC
  Filled 2014-09-02: qty 10

## 2014-09-02 MED ORDER — DEXAMETHASONE SODIUM PHOSPHATE 10 MG/ML IJ SOLN
INTRAMUSCULAR | Status: AC
  Filled 2014-09-02: qty 1

## 2014-09-02 MED ORDER — ATROPINE SULFATE 0.1 MG/ML IJ SOLN
INTRAMUSCULAR | Status: AC
  Filled 2014-09-02: qty 10

## 2014-09-02 MED ORDER — EPINEPHRINE HCL 0.1 MG/ML IJ SOSY
PREFILLED_SYRINGE | INTRAMUSCULAR | Status: AC | PRN
  Administered 2014-09-02: 0.3 mg via INTRAVENOUS

## 2014-09-02 MED ORDER — GADOBENATE DIMEGLUMINE 529 MG/ML IV SOLN
20.0000 mL | Freq: Once | INTRAVENOUS | Status: AC | PRN
  Administered 2014-09-02: 15 mL via INTRAVENOUS

## 2014-09-02 MED ORDER — PIPERACILLIN-TAZOBACTAM 3.375 G IVPB
3.3750 g | Freq: Once | INTRAVENOUS | Status: AC
  Administered 2014-09-02: 3.375 g via INTRAVENOUS
  Filled 2014-09-02: qty 50

## 2014-09-02 NOTE — Discharge Instructions (Signed)
Post Y-90 Radioembolization Discharge Instructions  You have been given a radioactive material during your procedure.  While it is safe for you to be discharged home from the hospital, you need to proceed directly home.    Do not use public transportation, including air travel, lasting more than 2 hours for 1 week.  Avoid crowded public places for 1 week.  Adult visitors should try to avoid close contact with you for 1 week.    Children and pregnant females should not visit or have close contact with you for 1 week.  Items that you touch are not radioactive.  Do not sleep in the same bed as your partner for 1 week, and a condom should be used for sexual activity during the first 24 hours.  Your blood may be radioactive and caution should be used if any bleeding occurs during the recovery period.  Body fluids may be radioactive for 24 hours.  Wash your hands after voiding.  Men should sit to urinate.  Dispose of any soiled materials (flush down toilet or place in trash at home) during the first day.  Drink 6 to 8 glasses of fluids per day for 5 days to hydrate yourself.  If you need to see a doctor during the first week, you must let them know that you were treated with yttrium-90 microspheres, and will be slightly radioactive.  They can call Interventional Radiology [phone] with any questions.   Conscious Sedation Sedation is the use of medicines to promote relaxation and relieve discomfort and anxiety. Conscious sedation is a type of sedation. Under conscious sedation you are less alert than normal but are still able to respond to instructions or stimulation. Conscious sedation is used during short medical and dental procedures. It is milder than deep sedation or general anesthesia and allows you to return to your regular activities sooner.  LET [facility] CARE PROVIDER KNOW ABOUT:   Any allergies you have.  All medicines you are taking, including vitamins, herbs, eye drops, creams,  and over-the-counter medicines.  Use of steroids (by mouth or creams).  Previous problems you or members of your family have had with the use of anesthetics.  Any blood disorders you have.  Previous surgeries you have had.  Medical conditions you have.  Possibility of pregnancy, if this applies.  Use of cigarettes, alcohol, or illegal drugs. RISKS AND COMPLICATIONS Generally, this is a safe procedure. However, as with any procedure, problems can occur. Possible problems include:  Oversedation.  Trouble breathing on your own. You may need to have a breathing tube until you are awake and breathing on your own.  Allergic reaction to any of the medicines used for the procedure. BEFORE THE PROCEDURE  You may have blood tests done. These tests can help show how well your kidneys and liver are working. They can also show how well your blood clots.  A physical exam will be done.  Only take medicines as directed by your health care provider. You may need to stop taking medicines (such as blood thinners, aspirin, or nonsteroidal anti-inflammatory drugs) before the procedure.   Do not eat or drink at least 6 hours before the procedure or as directed by your health care provider.  Arrange for a responsible adult, family member, or friend to take you home after the procedure. He or she should stay with you for at least 24 hours after the procedure, until the medicine has worn off. PROCEDURE   An intravenous (IV) catheter will be  inserted into one of your veins. Medicine will be able to flow directly into your body through this catheter. You may be given medicine through this tube to help prevent pain and help you relax.  The medical or dental procedure will be done. AFTER THE PROCEDURE  You will stay in a recovery area until the medicine has worn off. Your blood pressure and pulse will be checked.   Depending on the procedure you had, you may be allowed to go home when you can  tolerate liquids and your pain is under control. Document Released: 02/15/2001 Document Revised: 05/28/2013 Document Reviewed: 01/28/2013 [facility] Patient Information 2015 [place], [place]. This information is not intended to replace advice given to you by your health care provider. Make sure you discuss any questions you have with your health care   Arteriogram Care After These instructions give you information on caring for yourself after your procedure. Your doctor may also give you more specific instructions. Call your doctor if you have any problems or questions after your procedure. HOME CARE  Keep your leg straight for at least 6 hours.  Do not bathe, swim, or use a hot tub until directed by your doctor. You can shower.  Do not lift anything heavier than 10 pounds (about a gallon of milk) for 2 days.  Do not walk a lot, run, or drive for 2 days.  Return to normal activities in 2 days or as told by your doctor. Finding out the results of your test Ask when your test results will be ready. Make sure you get your test results. GET HELP RIGHT AWAY IF:   You have fever.  You have more pain in your leg.  The leg that was cut is:  Bleeding.  Puffy (swollen) or red.  Cold.  Pale or changes color.  Weak.  Tingly or numb. If you go to the Emergency Room, tell your nurse that you have had an arteriogram. Take this paper with you to show the nurse. MAKE SURE YOU:  Understand these instructions.  Will watch your condition.  Will get help right away if you are not doing well or get worse. Document Released: 08/19/2008 Document Revised: 05/28/2013 Document Reviewed: 08/19/2008 [facility] Patient Information 2015 [place], [place]. This information is not intended to replace advice given to you by your health care provider. Make sure you discuss any questions you have with your health care provider.

## 2014-09-02 NOTE — Sedation Documentation (Signed)
Pt back to baseline

## 2014-09-02 NOTE — Sedation Documentation (Addendum)
Pt became Tachycardic, Hypertensive, became flushed and was coughing forcefully stating that he felt as though he was choking and was having difficulty breathing.  O2 Sats stayed WNL.  Dr. Laurence Ferrari suspects IV Contrast allergy and was treated accordingly.

## 2014-09-02 NOTE — Progress Notes (Signed)
HOB elevated to 20 degrees. Patient comfortable. No change in rt groin

## 2014-09-02 NOTE — Progress Notes (Signed)
After 1 unsucessful attempt at peripheral IV access pt asks "can you [place] my port" . Port Placement added to surgical assessment on health history. Left chest PAC accessed and blood return without difficulty, flushed and connected to saline per pump.

## 2014-09-02 NOTE — Procedures (Signed)
Interventional Radiology Procedure Note  Procedure: RIGHT lobar TARE with Y90.  Access: Right CFA, 36F --> Cordis ExoSeal  Complications: New IV contrast allergy with bronchospasm and cardiac arrhythmia.  Responded to IV benadryl, Epinephrine (0.2 mg).   Estimated Blood Loss: 0  Recommendations: - To [facility] for [place] imaging - Bedrest x 4 hrs - DC home  Signed,  Criselda Peaches, MD

## 2014-09-02 NOTE — Sedation Documentation (Signed)
5 Fr sheath removed from R femoral artery by Dr. Laurence Ferrari. Hemostasis achieved using Exoseal closure device.  Groin level 0, 2+RDP.

## 2014-09-02 NOTE — Sedation Documentation (Signed)
Pt transported to Short Stay on bed with RN for recovery.

## 2014-09-02 NOTE — H&P (Signed)
Chief Complaint: Metastatic colon cancer to liver  Referring Physician(s): Dr. Cruzita Lederer  History of Present Illness: Marcus Buck is a 54 y.o. 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 Surveillance imaging in January 2015 demonstrated progression of hepatic metastatic disease, therefore he was transitioned to FOLFIRI plus Avastin and is now status post 26 treatments.Imaging obtained on 06/09/2014 again demonstrated progression of his hepatic metastatic disease. His Avastin was held at his last treatment session. He has been seen on 07/01/14 in consult to discuss further options regarding metastatic disease in his liver. He underwent Y-90 radioembo of the left lobe a few weeks ago and presents today for hepatic Y-90 radioembolization of the right lobe.   Past Medical History  Diagnosis Date  . Hypertension   . Anemia   . Palliative chemotherapy underway   . Cancer     Stage IV Metastatic adenocarcinoma of Liver, primary rectum    Past Surgical History  Procedure Laterality Date  . Portacath placement Left 2013    At [facility] in [place]    Allergies: Review of patient's allergies indicates no known allergies.  Medications: Prior to Admission medications   Medication Sig Start Date End Date Taking? Authorizing Provider  acetaminophen (TYLENOL) 500 MG tablet Take 500 mg by mouth every 6 (six) hours as needed for fever.   Yes Historical Provider, MD  amLODipine (NORVASC) 5 MG tablet Take 5 mg by mouth 2 (two) times daily.   Yes Historical Provider, MD  lisinopril (PRINIVIL,ZESTRIL) 20 MG tablet Take 20 mg by mouth 2 (two) times daily.   Yes Historical Provider, MD    History reviewed. No pertinent family history.  History   Social History  . Marital Status: Married    Spouse Name: N/A  . Number  of Children: N/A  . Years of Education: N/A   Social History Main Topics  . Smoking status: Former Smoker -- 0.26 packs/day    Types: Cigarettes    Start date: 07/01/1974    Quit date: 07/01/1988  . Smokeless tobacco: Not on file  . Alcohol Use: 3.6 oz/week    6 Cans of beer per week  . Drug Use: No  . Sexual Activity: Not on file   Other Topics Concern  . None   Social History Narrative     Review of Systems  Constitutional: Negative for fever and chills.  Respiratory: Negative for cough and shortness of breath.   Cardiovascular: Negative for chest pain.  Gastrointestinal: Negative for nausea, vomiting, abdominal pain and blood in stool.  Genitourinary: Negative for dysuria and hematuria.  Musculoskeletal: Negative for back pain.  Neurological: Negative for headaches.  Hematological: Does not bruise/bleed easily.    Vital Signs: BP 127/77 mmHg  Pulse 74  Temp(Src) 98.4 F (36.9 C) (Oral)  Resp 16  Ht _0  (1.803 m)  Wt 170 lb (77.111 kg)  BMI 23.72 kg/m2  SpO2 99%  Physical Exam  Constitutional: He is oriented to person, place, and time. He appears well-developed and well-nourished.  Cardiovascular: Normal rate and regular rhythm.   Pulmonary/Chest: Effort normal and breath sounds normal.  Abdominal: Soft. Bowel sounds are normal. There is no tenderness.  Musculoskeletal: Normal range of motion. He exhibits no edema.  Neurological: He is alert and oriented to person, place, and time.    Imaging:  Ir Ileana Ladd Tumor Organ  Ischemia Infarct Inc Guide Roadmapping  08/06/2014   CLINICAL DATA:  54 year old male with metastatic colorectal cancer. He presents today for left lobar trans arterial radio embolization with Y 90. Planned treatment dose approximately 16 millicurie.  EXAM: IR EMBO TUMOR ORGAN ISCHEMIA INFARCT INC GUIDE ROADMAPPING; ADDITIONAL ARTERIOGRAPHY; IR ULTRASOUND GUIDANCE VASC ACCESS RIGHT; SELECTIVE VISCERAL ARTERIOGRAPHY  Date: 08/06/2014  PROCEDURE: 1.  Ultrasound-guided puncture right common femoral artery 2. Catheterization of the celiac artery 3. Celiac arteriogram 4. Catheterization of the common hepatic artery with arteriogram 5. Catheterization of the left hepatic artery with arteriogram 6. Trans arterial radio embolization 7. Limited right common femoral arteriogram 8. Application of a Cordis ExoSeal device Interventional Radiologist:  Criselda Peaches, MD  ANESTHESIA/SEDATION: Moderate (conscious) sedation was used. 2.5 mg Versed, 100 mcg Fentanyl were administered intravenously. The patient's vital signs were monitored continuously by radiology nursing throughout the procedure.  Sedation Time:  120 minutes  MEDICATIONS: 40 mg Protonix, 20 mg Decadron, 4 mg Zofran and 3.375 mg Zosyn administered intravenously within 1 hour of skin incision.  FLUOROSCOPY TIME:  19 minutes 48 seconds  1338 mGy  CONTRAST:  50 mL Omnipaque 350 administered intravenously  TECHNIQUE: Informed consent was obtained from the patient following explanation of the procedure, risks, benefits and alternatives. The patient understands, agrees and consents for the procedure. All questions were addressed. A time out was performed.  Maximal barrier sterile technique utilized including caps, mask, sterile gowns, sterile gloves, large sterile drape, hand hygiene, and Betadine skin prep.  The right groin was interrogated with ultrasound. The common femoral artery was found to be widely patent. Local anesthesia was attained by infiltration with 1% lidocaine. A small dermatotomy was made. Under real-time sonographic guidance, the vessel was punctured with a 21 gauge micropuncture needle. Image was obtained and stored. With the assistance of a 5 [place] transitional micro sheath, the initial micro wire was exchanged for a 0.035 inch Bentson wire. The Bentson wire was advanced in the abdominal aorta. The transitional micro sheath was exchanged for a 5 [place] working vascular sheath.  End are C2  catheter was then advanced over the wire and used to select the celiac artery. A celiac arteriogram was performed. The replaced left hepatic artery is well visualized. No evidence of filling of the previously embolized gastroduodenal, right gastric or accessory segment 4 branches.  The direction J shaped micro catheter was then advanced over a fat and 16 wire and used to select the right common hepatic artery. An arteriogram was performed. Similar findings as above.  The micro catheter was then advanced up the common trunk of the left gastric and left hepatic artery and deep into the left hepatic artery. A power injection was then performed at 2 cc/second for a total volume of 8 cc. There is excellent opacification of the medial and lateral segments of the left hepatic lobe. There does appear to be increased cross filling following embolization of the accessory segment 4 branches. Hypermetabolic tissue blush is present. There is no evidence of accessory left gastric or other visceral arteries. No significant reflux.  The micro catheter was fixed in place.  Preparation was then made for administration of yttrium 90 microspheres. Per protocol, the micro severe delivery system was appropriately set up and flushed. A total dose of 16.4 mCi of yttrium 90 micro spheres was then administered in aliquots. Under fluoroscopic imaging antegrade flow was confirmed by contrast injection intermittently. There is no evidence of reflux or vascular stasis. Following administration of  the entire does, the micro catheter was brought back into the 5 French catheter and these were removed as a unit an appropriately disposed of.  A limited right common femoral arteriogram was then performed confirming common femoral arterial access. Hemostasis was attained with the assistance of a Cordis ExoSeal extra arterial vascular plug.  During the course of the procedure the patient did have 1 episode of coughing which resulted in a vasovagal  response and transient bradycardia. This was self-limited and corrected without any intervention. The patient remained clinically asymptomatic and was able to complete the procedure without issue.  COMPLICATIONS: None  IMPRESSION: Technically successful left lobar transarterial radioembolization (TARE) with 16.4 mCi yttrium 90 micro spheres.  PLAN: Follow-up laboratory evaluation in 2 weeks.  Right lobar Y90 treatment in 3-4 weeks.  Signed,  Criselda Peaches, MD  [facility]  [facility]   Electronically Signed   By: Jacqulynn Cadet M.D.   On: 08/06/2014 16:58   Nm Radio Pharm Therapy Intraarterial  08/06/2014   CLINICAL DATA:  Colorectal carcinoma with unresectable liver metastasis. Yttrium 90 radio embolization of the left hepatic lobe. First therapy to the left lobe  EXAM: NUCLEAR MEDICINE SPECIAL MED RAD PHYSICS CONS; NUCLEAR MEDICINE RADIO PHARM THERAPY INTRA ARTERIAL; NUCLEAR MEDICINE TREATMENT PROCEDURE; NUCLEAR MEDICINE LIVER SCAN  TECHNIQUE: In conjunction with the interventional radiologist a Y- Microsphere dose was calculated utilizing body surface area formulation. Calculated dose equal 16.4 mCi Yttrium 90 microspheres. Pre therapy MAA liver SPECT scan and CTA were evaluated. Utilizing a microcatheter system, the hepatic artery was selected and Y-90 microspheres were delivered in fractionated aliquots. Radiopharmaceutical was delivered by the interventional radiologist and nuclear radiologist.  The patient tolerated procedure well. No adverse effects were noted.  Bremsstrahlung planar and SPECT imaging of the abdomen following intrahepatic arterial delivery of Y-90 microsphere was performed.  RADIOPHARMACEUTICALS:  14.8 mCi Yttrium 90 microspheres  COMPARISON:  MAA scan 07/24/2014, CT abdomen 06/09/2014.  FINDINGS: Y - 90 microspheres therapy as above. First therapy the left hepatic lobe.  Bremsstrahlung planar and SPECT imaging of the abdomen  following intrahepatic arterial delivery of Y-58mcrosphere demonstrates radioactivity localized to the left hepatic lobe. No evidence of extrahepatic activity.  IMPRESSION: 1. Successful Y - 90 microsphere delivery for treatment of unresectable liver metastasis. First therapy to the left lobe.  2. Bremssstrahlung scan demonstrates activity localized to left hepatic lobe with no extrahepatic activity identified.   Electronically Signed   By: SSuzy BouchardM.D.   On: 08/06/2014 15:56    Labs:  CBC:  Recent Labs  07/24/14 0739 08/06/14 1152 09/02/14 0810  WBC 3.9* 6.1 6.1  HGB 12.5* 13.4 11.7*  HCT 38.3* 41.6 36.7*  PLT 225 451* 296    COAGS:  Recent Labs  07/24/14 0739 08/06/14 0745  INR 0.91 0.89  APTT 28 31    BMP:  Recent Labs  07/24/14 0739 07/24/14 0755 08/06/14 0745  NA 140 141 135  K 4.1 4.2 4.4  CL 106 105 102  CO2 _0 GLUCOSE 92 84 103*  BUN _1 CALCIUM 9.0 9.3 9.1  CREATININE 0.86 0.88 0.77  GFRNONAA >90 >90 >90  GFRAA >90 >90 >90    LIVER FUNCTION TESTS:  Recent Labs  07/24/14 0755 08/06/14 0745  BILITOT 0.4 0.5  AST 43* 72*  ALT 31 62*  ALKPHOS 86 191*  PROT 6.9 7.9  ALBUMIN 3.9 3.8    TUMOR MARKERS:  Recent Labs  08/06/14 0745  AFPTM 1.0  CEA 171.7*    Assessment and Plan: Marcus Buck is a 54 y.o. 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 Surveillance imaging in January 2015 demonstrated progression of hepatic metastatic disease, therefore he was transitioned to FOLFIRI plus Avastin and is now status post 26 treatments.Imaging obtained on 06/09/2014 again demonstrated progression of his hepatic metastatic disease. His Avastin was held at his last treatment session. He presents today for hepatic Y-90 radioembolization of the right lobe.   Details/risks of procedure (including ,but not limited to ,internal bleeding, infection, renal failure, nontarget embolization) d/w pt with his understanding and consent.   Thank you for this interesting consult.  I greatly enjoyed meeting Flynt Breeze and look forward to participating in their care.  SignedAscencion Dike 09/02/2014, 8:23 AM   I spent a total of 25 minutes face to face in clinical consultation, greater than 50% of which was counseling/coordinating care for hepatic Y-90 radioembolization

## 2014-09-02 NOTE — Sedation Documentation (Signed)
Pt transported to Nuclear Medicine for post imaging on bed with RN and monitor.

## 2014-09-03 ENCOUNTER — Other Ambulatory Visit: Payer: Self-pay | Admitting: *Deleted

## 2014-09-03 ENCOUNTER — Encounter: Payer: Self-pay | Admitting: *Deleted

## 2014-09-03 DIAGNOSIS — C189 Malignant neoplasm of colon, unspecified: Secondary | ICD-10-CM

## 2014-09-03 DIAGNOSIS — C787 Secondary malignant neoplasm of liver and intrahepatic bile duct: Principal | ICD-10-CM

## 2014-09-04 ENCOUNTER — Other Ambulatory Visit: Payer: Self-pay | Admitting: Interventional Radiology

## 2014-09-04 DIAGNOSIS — C189 Malignant neoplasm of colon, unspecified: Secondary | ICD-10-CM

## 2014-09-04 DIAGNOSIS — C787 Secondary malignant neoplasm of liver and intrahepatic bile duct: Principal | ICD-10-CM

## 2014-09-16 ENCOUNTER — Ambulatory Visit
Admission: RE | Admit: 2014-09-16 | Discharge: 2014-09-16 | Disposition: A | Discharge status: Home / Self Care | Payer: Medicare Other | Source: Ambulatory Visit | Attending: Interventional Radiology | Admitting: Interventional Radiology

## 2014-09-16 DIAGNOSIS — C189 Malignant neoplasm of colon, unspecified: Secondary | ICD-10-CM

## 2014-09-16 DIAGNOSIS — C787 Secondary malignant neoplasm of liver and intrahepatic bile duct: Principal | ICD-10-CM

## 2014-09-16 NOTE — Progress Notes (Signed)
Chief Complaint: Chief Complaint  Patient presents with  . Follow-up    Y-90 SIRT    Referring Physician(s): Dameir Gentzler  History of Present Illness: Marcus Buck is a 54 y.o. 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 He is currently status post 45 palliative treatments of systemic chemotherapy with FOLFOX plus Avastin. Surveillance imaging in January 2015 demonstrated progression of hepatic metastatic disease, therefore he was transitioned to FOLFIRI plus Avastin and is now status post 26 treatments.   Initial response to second line therapy was positive with initial response to therapy followed by stable disease. However, continued surveillance imaging obtained on 06/09/2014 again demonstrated progression of his hepatic metastatic disease. His Avastin was held at his last treatment session.   He underwent transarterial radioembolization of the right lobar hepatic artery with Y90 microspheres on 08/06/2014 followed by left lobar TARE on 09/02/14.  He presents today for his 2 week follow-up.  Marcus Buck continues to do remarkably well. He notes that he had some mild nausea and discomfort the first few days following the procedure, similar to his right lobar treatment.  He denies pain, fever, chills or decreased appetite. His appetite remains good. He had labs drawn last week in [facility] which I reviewed. His bilirubin remains normal at 0.6. No significant elevation of transaminases. CBC and renal function are also good.  He is back at work.   Past Medical History  Diagnosis Date  . Hypertension   . Anemia   . Palliative chemotherapy underway   . Cancer     Stage IV Metastatic adenocarcinoma of Liver, primary rectum    Past Surgical History  Procedure Laterality Date  . Portacath  placement Left 2013    At [facility] in [place]    Allergies: Contrast media  Medications: Prior to Admission medications   Medication Sig Start Date End Date Taking? Authorizing Provider  acetaminophen (TYLENOL) 500 MG tablet Take 500 mg by mouth every 6 (six) hours as needed for fever.    Historical Provider, MD  amLODipine (NORVASC) 5 MG tablet Take 5 mg by mouth 2 (two) times daily.    Historical Provider, MD  lisinopril (PRINIVIL,ZESTRIL) 20 MG tablet Take 20 mg by mouth 2 (two) times daily.    Historical Provider, MD     No family history on file.  History   Social History  . Marital Status: Married    Spouse Name: N/A  . Number of Children: N/A  . Years of Education: N/A   Social History Main Topics  . Smoking status: Former Smoker -- 0.26 packs/day    Types: Cigarettes    Start date: 07/01/1974    Quit date: 07/01/1988  . Smokeless tobacco: Not on file  . Alcohol Use: 3.6 oz/week    6 Cans of beer per week  . Drug Use: No  . Sexual Activity: Not on file   Other Topics Concern  . Not on file   Social History Narrative    ECOG Status: 0 - Asymptomatic  Review of Systems: A 12 point ROS discussed and pertinent positives are indicated in the HPI above.  All other systems are negative.  Review of Systems  Vital Signs: BP 149/83 mmHg  Pulse 88  Temp(Src) 98.8 F (37.1 C) (Oral)  Resp 13  SpO2 100%  Physical Exam  Constitutional: He is oriented to person, place, and time. He appears well-developed and well-nourished.  HENT:  Head: Normocephalic and atraumatic.  Eyes: No scleral icterus.  Cardiovascular: Normal rate.   Pulmonary/Chest: Effort normal.  Abdominal: Soft. There is no tenderness.  Neurological: He is alert and oriented to person, place, and time.  Skin: Skin is warm and dry.  Psychiatric: He has a normal mood and affect. His behavior is normal.  Nursing note and vitals reviewed.   Labs:  CBC:  4/4 [facility] WBC 6.5 Hgb  13.2 Hct 41.6 Plt 334   Recent Labs  07/24/14 0739 08/06/14 1152 09/02/14 0810  WBC 3.9* 6.1 6.1  HGB 12.5* 13.4 11.7*  HCT 38.3* 41.6 36.7*  PLT 225 451* 296    COAGS:  4/4 [facility] INR 1.0   Recent Labs  07/24/14 0739 08/06/14 0745 09/02/14 0810  INR 0.91 0.89 0.92  APTT 28 31 32    BMP:  4/4 [facility] Cr 0.80 BUN 8  Recent Labs  07/24/14 0739 07/24/14 0755 08/06/14 0745 09/02/14 0810  NA 140 141 135 139  K 4.1 4.2 4.4 4.1  CL 106 105 102 104  CO2 28 27 26 28   GLUCOSE 92 84 103* 97  BUN 14 13 12 9   CALCIUM 9.0 9.3 9.1 8.8  CREATININE 0.86 0.88 0.77 0.77  GFRNONAA >90 >90 >90 >90  GFRAA >90 >90 >90 >90    LIVER FUNCTION TESTS:  4/4 [facility] AST 71 ALT 98 Br 0.6 Alb 4.1   Recent Labs  07/24/14 0755 08/06/14 0745 09/02/14 0810  BILITOT 0.4 0.5 0.3  AST 43* 72* 37  ALT 31 62* 34  ALKPHOS 86 191* 173*  PROT 6.9 7.9 7.1  ALBUMIN 3.9 3.8 3.4*    TUMOR MARKERS:  Recent Labs  08/06/14 0745  AFPTM 1.0  CEA 171.7*    Assessment and Plan:  Marcus Buck continues to do remarkably well following transarterial radial embolization with Y 90 therapy. He has now completed his initial by lobar treatment. His post procedural course has been uncomplicated and he is now back at baseline and returned to part-time work.  1.) Initial post treatment imaging to be performed at 3 months status post his first lobar treatment which was performed on 08/06/2014. We will plan to image him with MRI of the liver with and without contrast in early June.  MRI surveillance will be critical as he has now developed a serious allergy to intravenous iodinated contrast.  2.) Return clinic visit in June following his MRI to review the images. If he has had a significant response to therapy we will continue surveillance imaging every 3 months. If the response is less than desired, we will discuss repeat TARE with Y90. Studies have shown that a second treatment  session can convert nonresponders to responders.    SignedJacqulynn Cadet 09/16/2014, 8:51 AM   I spent a total of  10 Minutes in face to face in clinical consultation, greater than 50% of which was counseling/coordinating care for colon cancer metastatic to the liver

## 2014-12-23 ENCOUNTER — Encounter: Payer: Self-pay | Admitting: Radiology

## 2015-03-25 ENCOUNTER — Telehealth: Payer: Self-pay | Admitting: Radiology

## 2015-03-25 NOTE — Telephone Encounter (Signed)
Left message for patient to call for update:  Dr Laurence Ferrari reviewed recent office notes, labs, imaging, etc from Dr Cruzita Lederer.  Per Dr Laurence Ferrari:  Liver disease is stable.  Y-90 & chemo are "holding liver disease" in check at present.   Karessa Onorato Riki Rusk, RN 03/25/2015 11:55 AM

## 2016-03-16 IMAGING — NM NM SPECIAL TREATMENT PROCEDURE
5 series · 30 of 30 positions shown · non-contrast
Comparison: MAA scan 07/24/2014, angiography 08/06/2014, CT
06/09/2014.

CLINICAL DATA: Colorectal carcinoma with unresectable liver
metastasis. Yttrium 90 radio embolization of the right hepatic lobe.
First therapy to the right lobe.  Second total therapy.

EXAM:
NUCLEAR MEDICINE SPECIAL MED RAD PHYSICS CONS; NUCLEAR MEDICINE
RADIO PHARM THERAPY INTRA ARTERIAL; NUCLEAR MEDICINE TREATMENT
PROCEDURE; NUCLEAR MEDICINE LIVER SCAN
TECHNIQUE: In conjunction with the interventional radiologist a Y- Microsphere
dose was calculated utilizing body surface area formulation.
Calculated dose equal 38 mCi. Pre therapy MAA liver SPECT scan and
CTA were evaluated. Utilizing a microcatheter system, the hepatic
artery was selected and Y-90 microspheres were delivered in
fractionated aliquots. Radiopharmaceutical was delivered by the
interventional radiologist and nuclear radiologist.
The patient tolerated procedure well. No adverse effects were noted.
Bremsstrahlung planar and SPECT imaging of the abdomen following
intrahepatic arterial delivery of Y-90 microsphere was performed.
RADIOPHARMACEUTICALS:  34.3 mCi Yttrium 90 microspheres.

[Series 1: spect - (id) _(id)_sa · 4.1mm · 4.14mm/px · 6 of 128 frames shown]
[frame 11/128]
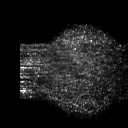
[frame 32/128]
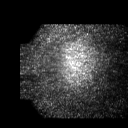
[frame 54/128]
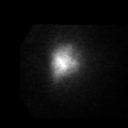
[frame 75/128]
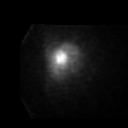
[frame 96/128]
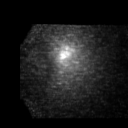
[frame 118/128]
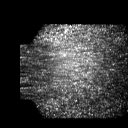

[Series 1: spect - (id) _(id)_tra · 4.1mm · 4.14mm/px · 6 of 128 frames shown]
[frame 11/128]
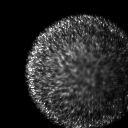
[frame 32/128]
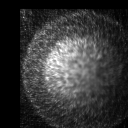
[frame 54/128]
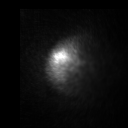
[frame 75/128]
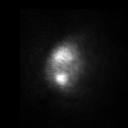
[frame 96/128]
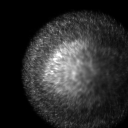
[frame 118/128]
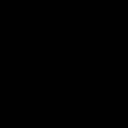

[Series 1: spect - (id) _(id)_cor · 4.1mm · 4.14mm/px · 6 of 128 frames shown]
[frame 11/128]
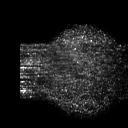
[frame 32/128]
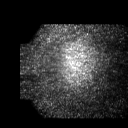
[frame 54/128]
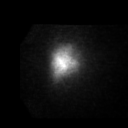
[frame 75/128]
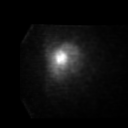
[frame 96/128]
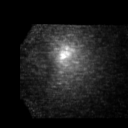
[frame 118/128]
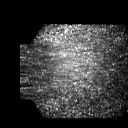

[Series 2: y-90 spect · 4.14mm/px · 6 of 64 frames shown]
[frame 6/64]
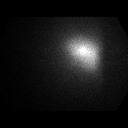
[frame 16/64]
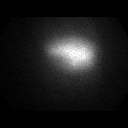
[frame 27/64]
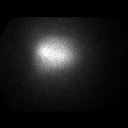
[frame 38/64]
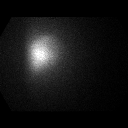
[frame 48/64]
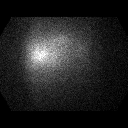
[frame 59/64]
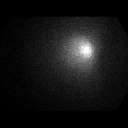

[Series 2: wbr_bone y-90 spect · 4.1mm · 4.14mm/px · 6 of 128 frames shown]
[frame 11/128]
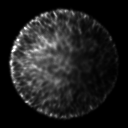
[frame 32/128]
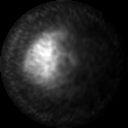
[frame 54/128]
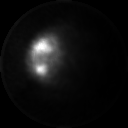
[frame 75/128]
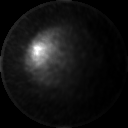
[frame 96/128]
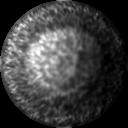
[frame 118/128]
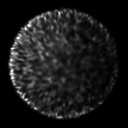

[30 of 30 positions shown; findings below may reference images not displayed]

FINDINGS: [AGE] microspheres therapy as above. First therapy to the right
hepatic lobe. Second therapy overall. Initial therapy to the left
hepatic lobe on 08/06/2014.

Bremsstrahlung planar and SPECT imaging of the abdomen following
intrahepatic arterial delivery of A-9Imicrosphere demonstrates
radioactivity localized to the right hepatic lobe. No evidence of
extrahepatic activity.
IMPRESSION: Successful [AGE] microsphere delivery for treatment of unresectable
liver metastasis. First therapy to the right lobe.

Bremssstrahlung scan demonstrates activity localized to right
hepatic lobe with no extrahepatic activity identified.

## 2016-06-08 ENCOUNTER — Encounter: Payer: Self-pay | Admitting: Hematology & Oncology

## 2016-09-03 ENCOUNTER — Encounter (HOSPITAL_BASED_OUTPATIENT_CLINIC_OR_DEPARTMENT_OTHER): Payer: Self-pay

## 2016-09-03 ENCOUNTER — Emergency Department (HOSPITAL_BASED_OUTPATIENT_CLINIC_OR_DEPARTMENT_OTHER)
Admission: EM | Admit: 2016-09-03 | Discharge: 2016-09-03 | Disposition: A | Discharge status: Home / Self Care | Payer: Medicare Other | Attending: Emergency Medicine | Admitting: Emergency Medicine

## 2016-09-03 DIAGNOSIS — E119 Type 2 diabetes mellitus without complications: Secondary | ICD-10-CM | POA: Diagnosis not present

## 2016-09-03 DIAGNOSIS — I10 Essential (primary) hypertension: Secondary | ICD-10-CM | POA: Diagnosis not present

## 2016-09-03 DIAGNOSIS — K9409 Other complications of colostomy: Secondary | ICD-10-CM | POA: Diagnosis not present

## 2016-09-03 DIAGNOSIS — Z85038 Personal history of other malignant neoplasm of large intestine: Secondary | ICD-10-CM | POA: Insufficient documentation

## 2016-09-03 DIAGNOSIS — Z87891 Personal history of nicotine dependence: Secondary | ICD-10-CM | POA: Diagnosis not present

## 2016-09-03 DIAGNOSIS — Z5189 Encounter for other specified aftercare: Secondary | ICD-10-CM

## 2016-09-03 DIAGNOSIS — Z4801 Encounter for change or removal of surgical wound dressing: Secondary | ICD-10-CM | POA: Diagnosis present

## 2016-09-03 HISTORY — DX: Type 2 diabetes mellitus without complications: E11.9

## 2016-09-03 NOTE — ED Provider Notes (Signed)
[place] DEPT [facility] Provider Note   CSN: [id number] Arrival date & time: 09/03/16  1316  By signing my name below, I, [name], attest that this documentation has been prepared under the direction and in the presence of Veryl Speak, MD. Electronically Signed: [name], Scribe. 09/03/16. 4:17 PM.  History   Chief Complaint Chief Complaint  Patient presents with  . Post-op Problem    HPI Comments: Marcus Buck is a 56 y.o. male with a history of colon cancer, who presents to the Emergency Department complaining of colostomy discharge that began a few days ago. Patient reports having a colostomy placed 2 weeks ago, in which he haas been having "runny" discharge in his colostomy bag. Patient reports associated skin break down. Patient believes this problem is due to what he has been eating. No modifying factors indicated. Patient denies any nausea, vomiting, abdominal pain, or fever.   The history is provided by the patient. No language interpreter was used.    Past Medical History:  Diagnosis Date  . Anemia   . Cancer ([place])    Stage IV Metastatic adenocarcinoma of Liver, primary rectum  . Diabetes mellitus without complication ([place])   . Hypertension   . Palliative chemotherapy underway     Patient Active Problem List   Diagnosis Date Noted  . Colon cancer metastasized to liver [facility])     Past Surgical History:  Procedure Laterality Date  . COLON SURGERY    . IR GENERIC HISTORICAL  07/01/2014   IR RADIOLOGIST EVAL & MGMT 07/01/2014 [facility] INTERV RAD  . IR GENERIC HISTORICAL  08/21/2014   IR RADIOLOGIST EVAL & MGMT 08/21/2014 Jacqulynn Cadet, MD [facility] INTERV RAD  . PORTACATH PLACEMENT Left 2013   At [facility] in [place]       Home Medications    Prior to Admission medications   Medication Sig Start Date End Date Taking? Authorizing Provider  furosemide (LASIX) 40 MG tablet Take 40 mg by mouth.   Yes Historical Provider, MD  spironolactone  (ALDACTONE) 100 MG tablet Take 100 mg by mouth daily.   Yes Historical Provider, MD  acetaminophen (TYLENOL) 500 MG tablet Take 500 mg by mouth every 6 (six) hours as needed for fever.    Historical Provider, MD  amLODipine (NORVASC) 5 MG tablet Take 5 mg by mouth 2 (two) times daily.    Historical Provider, MD  lisinopril (PRINIVIL,ZESTRIL) 20 MG tablet Take 20 mg by mouth 2 (two) times daily.    Historical Provider, MD    Family History No family history on file.  Social History Social History  Substance Use Topics  . Smoking status: Former Smoker    Packs/day: 0.26    Types: Cigarettes    Start date: 07/01/1974    Quit date: 07/01/1988  . Smokeless tobacco: Never Used  . Alcohol use No     Comment: former; last drink feburary 2018     Allergies   Contrast media [iodinated diagnostic agents]   Review of Systems Review of Systems  Constitutional: Negative for fever.  Gastrointestinal: Negative for abdominal pain, nausea and vomiting.       Colostomy discharge.   All other systems reviewed and are negative.    Physical Exam Updated Vital Signs BP 114/79 (BP Location: Right Arm)   Pulse 93   Temp 98.9 F (37.2 C) (Oral)   Resp 18   Ht 5\' 10"  (1.778 m)   Wt 150 lb (68 kg)   SpO2 100%  BMI 21.52 kg/m   Physical Exam  Constitutional: He is oriented to person, place, and time. He appears well-developed and well-nourished.  HENT:  Head: Normocephalic and atraumatic.  Eyes: EOM are normal.  Neck: Normal range of motion.  Cardiovascular: Normal rate, regular rhythm and intact distal pulses.   No murmur heard. Pulmonary/Chest: Effort normal and breath sounds normal. No respiratory distress.  Abdominal: Soft. He exhibits no distension. There is no tenderness.  Abdomen is soft and non-distended. No tenderness. Colostomy bag is in place, draining liquidy brown stool. No blood or melena. The skin around the ostomy showing mild break down.   Musculoskeletal: Normal range  of motion.  Neurological: He is alert and oriented to person, place, and time.  Skin: Skin is warm and dry.  Psychiatric: He has a normal mood and affect. Judgment normal.  Nursing note and vitals reviewed.    ED Treatments / Results  DIAGNOSTIC STUDIES: Oxygen Saturation is 100% on RA, normal by my interpretation.    COORDINATION OF CARE: 4:16 PM Discussed treatment plan with pt at bedside and pt agreed to plan.   Labs (all labs ordered are listed, but only abnormal results are displayed) Labs Reviewed - No data to display  EKG  EKG Interpretation None       Radiology No results found.  Procedures Procedures (including critical care time)  Medications Ordered in ED Medications - No data to display   Initial Impression / Assessment and Plan / ED Course  I have reviewed the triage vital signs and the nursing notes.  Pertinent labs & imaging results that were available during my care of the patient were reviewed by me and considered in my medical decision making (see chart for details).  Patient with recent colostomy performed at [facility]. He presents here today with complaints of a change in the character of his stool. It is now more liquid and yellow brown in color. He is experiencing no pain, no fever, no blood or melena. I see nothing that appears concerning. His abdomen is benign. The patient will follow-up with his surgeon this week, sooner if he experiences any problems.  Final Clinical Impressions(s) / ED Diagnoses   Final diagnoses:  None    New Prescriptions New Prescriptions   No medications on file   I personally performed the services described in this documentation, which was scribed in my presence. The recorded information has been reviewed and is accurate.        Veryl Speak, MD 09/03/16 [phone]

## 2016-09-03 NOTE — ED Notes (Signed)
Pt denies NV

## 2016-09-03 NOTE — ED Notes (Signed)
ED Provider at bedside. 

## 2016-09-03 NOTE — ED Triage Notes (Signed)
Pt had a colostomy performed 2 weeks ago. Pt reports change in output yesterday evening to a watery output

## 2016-09-03 NOTE — Discharge Instructions (Signed)
Continue colostomy care as before.  Return to the emergency department if you develop abdominal pain, abdominal distention, black or bloody stool, or other new and concerning symptoms.  Follow-up with your surgeon on Monday.

## 2016-12-28 ENCOUNTER — Emergency Department (HOSPITAL_BASED_OUTPATIENT_CLINIC_OR_DEPARTMENT_OTHER)
Admission: EM | Admit: 2016-12-28 | Discharge: 2016-12-28 | Disposition: A | Discharge status: Home / Self Care | Payer: Medicare Other | Attending: Emergency Medicine | Admitting: Emergency Medicine

## 2016-12-28 ENCOUNTER — Encounter (HOSPITAL_BASED_OUTPATIENT_CLINIC_OR_DEPARTMENT_OTHER): Payer: Self-pay | Admitting: Emergency Medicine

## 2016-12-28 DIAGNOSIS — C19 Malignant neoplasm of rectosigmoid junction: Secondary | ICD-10-CM

## 2016-12-28 DIAGNOSIS — Z79899 Other long term (current) drug therapy: Secondary | ICD-10-CM | POA: Insufficient documentation

## 2016-12-28 DIAGNOSIS — K7460 Unspecified cirrhosis of liver: Secondary | ICD-10-CM | POA: Diagnosis not present

## 2016-12-28 DIAGNOSIS — C189 Malignant neoplasm of colon, unspecified: Secondary | ICD-10-CM | POA: Insufficient documentation

## 2016-12-28 DIAGNOSIS — E119 Type 2 diabetes mellitus without complications: Secondary | ICD-10-CM | POA: Diagnosis not present

## 2016-12-28 DIAGNOSIS — K769 Liver disease, unspecified: Secondary | ICD-10-CM

## 2016-12-28 DIAGNOSIS — I10 Essential (primary) hypertension: Secondary | ICD-10-CM | POA: Diagnosis not present

## 2016-12-28 DIAGNOSIS — Z87891 Personal history of nicotine dependence: Secondary | ICD-10-CM | POA: Insufficient documentation

## 2016-12-28 DIAGNOSIS — R188 Other ascites: Secondary | ICD-10-CM

## 2016-12-28 DIAGNOSIS — R140 Abdominal distension (gaseous): Secondary | ICD-10-CM | POA: Diagnosis present

## 2016-12-28 NOTE — Discharge Instructions (Signed)
It was our pleasure to provide your ER care today - we hope that you feel better.  Follow up for outpatient ultrasound paracentesis  - we have placed the order - follow up with radiologist department to confirm the time of your appointment.   Also, follow up with primary care doctor in the next couple weeks.  Return to ER if worse, new symptoms, fevers, severe abdominal pain, persistent vomiting, increased difficulty breathing, other concern.

## 2016-12-28 NOTE — ED Triage Notes (Signed)
Pt c/o abd distention x 1 wk; denies pain; reports output in colostomy normal.

## 2016-12-28 NOTE — ED Provider Notes (Signed)
[place] DEPT [facility] Provider Note   CSN: [id number] Arrival date & time: 12/28/16  8891     History   Chief Complaint Chief Complaint  Patient presents with  . Bloated    HPI Marcus Buck is a 56 y.o. male.  Patient with stage IV colorectal adenoca, hx liver disease/cirrhosis, s/p multiple rounds chemo, now wanting to pursue palliative care, presents w abdmominal distension/ascites.  Pt with prior paracentesis, last of which was approx 6 months ago.  Pt notes gradual increased ascites/distension. Is having normal ostomy output. No vomiting. No fever or chills. Symptoms moderate, persistent, slowly progressive.    The history is provided by the patient.    Past Medical History:  Diagnosis Date  . Anemia   . Cancer ([place])    Stage IV Metastatic adenocarcinoma of Liver, primary rectum  . Diabetes mellitus without complication ([place])   . Hypertension   . Palliative chemotherapy underway     Patient Active Problem List   Diagnosis Date Noted  . Colon cancer metastasized to liver [facility])     Past Surgical History:  Procedure Laterality Date  . COLON SURGERY    . IR GENERIC HISTORICAL  07/01/2014   IR RADIOLOGIST EVAL & MGMT 07/01/2014 [facility] INTERV RAD  . IR GENERIC HISTORICAL  08/21/2014   IR RADIOLOGIST EVAL & MGMT 08/21/2014 Jacqulynn Cadet, MD [facility] INTERV RAD  . PORTACATH PLACEMENT Left 2013   At [facility] in [place]       Home Medications    Prior to Admission medications   Medication Sig Start Date End Date Taking? Authorizing Provider  acetaminophen (TYLENOL) 500 MG tablet Take 500 mg by mouth every 6 (six) hours as needed for fever.    [provider]  amLODipine (NORVASC) 5 MG tablet Take 5 mg by mouth 2 (two) times daily.    [provider]  furosemide (LASIX) 40 MG tablet Take 40 mg by mouth.    [provider]  lisinopril (PRINIVIL,ZESTRIL) 20 MG tablet Take 20 mg by mouth 2 (two) times daily.    [provider]  spironolactone (ALDACTONE) 100 MG tablet Take 100 mg by mouth daily.    [provider]    Family History No family history on file.  Social History Social History  Substance Use Topics  . Smoking status: Former Smoker    Packs/day: 0.26    Types: Cigarettes    Start date: 07/01/1974    Quit date: 07/01/1988  . Smokeless tobacco: Never Used  . Alcohol use No     Comment: former; last drink feburary 2018     Allergies   Contrast media [iodinated diagnostic agents]   Review of Systems Review of Systems  Constitutional: Negative for fever.  HENT: Negative for sore throat.   Eyes: Negative for redness.  Respiratory: Negative for shortness of breath.   Cardiovascular: Negative for chest pain.  Gastrointestinal: Positive for abdominal distention. Negative for constipation and vomiting.  Genitourinary: Negative for flank pain.  Musculoskeletal: Negative for back pain and neck pain.  Skin: Negative for rash.  Neurological: Negative for headaches.  Hematological: Does not bruise/bleed easily.  Psychiatric/Behavioral: Negative for confusion.     Physical Exam Updated Vital Signs BP 128/90 (BP Location: Right Arm)   Pulse (!) 107   Temp 98.3 F (36.8 C) (Oral)   Resp 18   Ht 1.778 m (5\' 10" )   Wt 68.5 kg (151 lb)   SpO2 97%   BMI 21.67 kg/m  Physical Exam  Constitutional: He appears well-developed and well-nourished. No distress.  HENT:  Mouth/Throat: Oropharynx is clear and moist.  Eyes: Conjunctivae are normal.  Neck: Neck supple. No tracheal deviation present.  Cardiovascular: Normal rate.   Pulmonary/Chest: Effort normal and breath sounds normal. No accessory muscle usage. No respiratory distress.  Abdominal: Soft. Bowel sounds are normal. He exhibits distension. He exhibits no mass. There is no tenderness. There is no rebound and no guarding. No hernia.  +ascites,. Right ostomy, pink, patent, functioning w stool in bag, lightbrown.     Musculoskeletal: He exhibits no edema.  Neurological: He is alert.  Skin: Skin is warm and dry. He is not diaphoretic.  Psychiatric: He has a normal mood and affect.  Nursing note and vitals reviewed.    ED Treatments / Results  Labs (all labs ordered are listed, but only abnormal results are displayed) Labs Reviewed - No data to display  EKG  EKG Interpretation None       Radiology No results found.  Procedures Procedures (including critical care time)  Medications Ordered in ED Medications - No data to display   Initial Impression / Assessment and Plan / ED Course  I have reviewed the triage vital signs and the nursing notes.  Pertinent labs & imaging results that were available during my care of the patient were reviewed by me and considered in my medical decision making (see chart for details).  No abd tenderness. Afeb.   Nursing to schedule patient for outpatient therapeutic ultrasound/paracentesis.   Reviewed nursing notes and prior charts for additional history.     Final Clinical Impressions(s) / ED Diagnoses   Final diagnoses:  None    New Prescriptions New Prescriptions   No medications on file     Lajean Saver, MD 12/28/16 [phone]

## 2017-02-04 DEATH — deceased
